# Patient Record
Sex: Male | Born: 1960 | Race: White | Hispanic: No | Marital: Married | Smoking: Never smoker
Health system: Southern US, Community
[De-identification: ages and names within clinical notes are randomized; demographics above are authoritative.]

## PROBLEM LIST (undated history)

## (undated) DIAGNOSIS — N200 Calculus of kidney: Secondary | ICD-10-CM

## (undated) DIAGNOSIS — I10 Essential (primary) hypertension: Secondary | ICD-10-CM

## (undated) DIAGNOSIS — F32A Depression, unspecified: Secondary | ICD-10-CM

## (undated) DIAGNOSIS — J189 Pneumonia, unspecified organism: Secondary | ICD-10-CM

## (undated) DIAGNOSIS — I639 Cerebral infarction, unspecified: Secondary | ICD-10-CM

## (undated) DIAGNOSIS — F329 Major depressive disorder, single episode, unspecified: Secondary | ICD-10-CM

## (undated) HISTORY — PX: OTHER SURGICAL HISTORY: SHX169

## (undated) HISTORY — PX: KNEE SURGERY: SHX244

## (undated) HISTORY — PX: CYSTOSCOPY: SUR368

## (undated) HISTORY — PX: HERNIA REPAIR: SHX51

## (undated) HISTORY — PX: JOINT REPLACEMENT: SHX530

## (undated) HISTORY — PX: LITHOTRIPSY: SUR834

---

## 2008-06-05 ENCOUNTER — Ambulatory Visit: Payer: Self-pay | Admitting: Radiology

## 2008-06-05 ENCOUNTER — Ambulatory Visit (HOSPITAL_BASED_OUTPATIENT_CLINIC_OR_DEPARTMENT_OTHER): Admission: RE | Admit: 2008-06-05 | Discharge: 2008-06-05 | Payer: Self-pay | Admitting: Orthopedic Surgery

## 2008-07-01 ENCOUNTER — Encounter: Admission: RE | Admit: 2008-07-01 | Discharge: 2008-09-04 | Payer: Self-pay | Admitting: Orthopedic Surgery

## 2008-09-24 ENCOUNTER — Ambulatory Visit: Payer: Self-pay | Admitting: Diagnostic Radiology

## 2008-09-24 ENCOUNTER — Ambulatory Visit (HOSPITAL_BASED_OUTPATIENT_CLINIC_OR_DEPARTMENT_OTHER): Admission: RE | Admit: 2008-09-24 | Discharge: 2008-09-24 | Payer: Self-pay | Admitting: Family Medicine

## 2008-10-09 ENCOUNTER — Ambulatory Visit (HOSPITAL_BASED_OUTPATIENT_CLINIC_OR_DEPARTMENT_OTHER): Admission: RE | Admit: 2008-10-09 | Discharge: 2008-10-09 | Payer: Self-pay | Admitting: Orthopedic Surgery

## 2008-10-09 ENCOUNTER — Ambulatory Visit: Payer: Self-pay | Admitting: Radiology

## 2009-12-04 ENCOUNTER — Inpatient Hospital Stay (HOSPITAL_COMMUNITY): Admission: RE | Admit: 2009-12-04 | Discharge: 2009-12-06 | Payer: Self-pay | Admitting: Orthopedic Surgery

## 2010-01-12 ENCOUNTER — Encounter: Admission: RE | Admit: 2010-01-12 | Discharge: 2010-03-16 | Payer: Self-pay | Admitting: Orthopedic Surgery

## 2010-02-02 ENCOUNTER — Encounter: Admission: RE | Admit: 2010-02-02 | Discharge: 2010-03-25 | Payer: Self-pay | Admitting: Internal Medicine

## 2010-05-07 ENCOUNTER — Inpatient Hospital Stay (HOSPITAL_COMMUNITY): Admission: RE | Admit: 2010-05-07 | Discharge: 2010-05-10 | Payer: Self-pay | Admitting: Orthopedic Surgery

## 2010-06-30 ENCOUNTER — Encounter
Admission: RE | Admit: 2010-06-30 | Discharge: 2010-07-28 | Payer: Self-pay | Source: Home / Self Care | Attending: Orthopedic Surgery | Admitting: Orthopedic Surgery

## 2010-07-22 ENCOUNTER — Ambulatory Visit (HOSPITAL_COMMUNITY)
Admission: RE | Admit: 2010-07-22 | Discharge: 2010-07-22 | Payer: Self-pay | Source: Home / Self Care | Attending: Orthopedic Surgery | Admitting: Orthopedic Surgery

## 2010-07-29 ENCOUNTER — Ambulatory Visit: Payer: BC Managed Care – PPO | Attending: Orthopedic Surgery | Admitting: Rehabilitation

## 2010-07-29 DIAGNOSIS — IMO0001 Reserved for inherently not codable concepts without codable children: Secondary | ICD-10-CM | POA: Insufficient documentation

## 2010-07-29 DIAGNOSIS — M25559 Pain in unspecified hip: Secondary | ICD-10-CM | POA: Insufficient documentation

## 2010-07-29 DIAGNOSIS — R262 Difficulty in walking, not elsewhere classified: Secondary | ICD-10-CM | POA: Insufficient documentation

## 2010-08-03 ENCOUNTER — Encounter: Payer: Self-pay | Admitting: Rehabilitation

## 2010-09-08 LAB — CBC
Hemoglobin: 10.7 g/dL — ABNORMAL LOW (ref 13.0–17.0)
MCH: 31.2 pg (ref 26.0–34.0)
MCH: 31.3 pg (ref 26.0–34.0)
MCHC: 34.5 g/dL (ref 30.0–36.0)
MCHC: 34.6 g/dL (ref 30.0–36.0)
MCHC: 34.8 g/dL (ref 30.0–36.0)
MCHC: 34.9 g/dL (ref 30.0–36.0)
MCV: 89.3 fL (ref 78.0–100.0)
MCV: 89.4 fL (ref 78.0–100.0)
Platelets: 153 10*3/uL (ref 150–400)
Platelets: 157 10*3/uL (ref 150–400)
Platelets: 168 10*3/uL (ref 150–400)
Platelets: 251 10*3/uL (ref 150–400)
RDW: 13.4 % (ref 11.5–15.5)
RDW: 13.6 % (ref 11.5–15.5)
RDW: 13.6 % (ref 11.5–15.5)
RDW: 13.8 % (ref 11.5–15.5)
WBC: 5.9 10*3/uL (ref 4.0–10.5)
WBC: 7.8 10*3/uL (ref 4.0–10.5)
WBC: 8.2 10*3/uL (ref 4.0–10.5)

## 2010-09-08 LAB — URINE MICROSCOPIC-ADD ON

## 2010-09-08 LAB — SURGICAL PCR SCREEN
MRSA, PCR: NEGATIVE
Staphylococcus aureus: NEGATIVE

## 2010-09-08 LAB — DIFFERENTIAL
Basophils Relative: 1 % (ref 0–1)
Lymphs Abs: 1.4 10*3/uL (ref 0.7–4.0)
Monocytes Absolute: 0.5 10*3/uL (ref 0.1–1.0)
Monocytes Relative: 8 % (ref 3–12)
Neutro Abs: 3.7 10*3/uL (ref 1.7–7.7)

## 2010-09-08 LAB — COMPREHENSIVE METABOLIC PANEL
ALT: 18 U/L (ref 0–53)
Albumin: 2.8 g/dL — ABNORMAL LOW (ref 3.5–5.2)
Albumin: 4.1 g/dL (ref 3.5–5.2)
Alkaline Phosphatase: 57 U/L (ref 39–117)
Alkaline Phosphatase: 64 U/L (ref 39–117)
BUN: 17 mg/dL (ref 6–23)
BUN: 9 mg/dL (ref 6–23)
Calcium: 8.1 mg/dL — ABNORMAL LOW (ref 8.4–10.5)
Potassium: 3.6 mEq/L (ref 3.5–5.1)
Potassium: 4.1 mEq/L (ref 3.5–5.1)
Sodium: 140 mEq/L (ref 135–145)
Sodium: 142 mEq/L (ref 135–145)
Total Protein: 5.4 g/dL — ABNORMAL LOW (ref 6.0–8.3)
Total Protein: 7.3 g/dL (ref 6.0–8.3)

## 2010-09-08 LAB — URINALYSIS, ROUTINE W REFLEX MICROSCOPIC
Glucose, UA: NEGATIVE mg/dL
Glucose, UA: NEGATIVE mg/dL
Ketones, ur: NEGATIVE mg/dL
Leukocytes, UA: NEGATIVE
Leukocytes, UA: NEGATIVE
Specific Gravity, Urine: 1.018 (ref 1.005–1.030)
Specific Gravity, Urine: 1.028 (ref 1.005–1.030)
pH: 6 (ref 5.0–8.0)
pH: 6.5 (ref 5.0–8.0)

## 2010-09-08 LAB — BASIC METABOLIC PANEL
BUN: 11 mg/dL (ref 6–23)
CO2: 28 mEq/L (ref 19–32)
Calcium: 8.2 mg/dL — ABNORMAL LOW (ref 8.4–10.5)
Calcium: 8.2 mg/dL — ABNORMAL LOW (ref 8.4–10.5)
Creatinine, Ser: 0.97 mg/dL (ref 0.4–1.5)
Creatinine, Ser: 1.06 mg/dL (ref 0.4–1.5)
GFR calc non Af Amer: >60 mL/min (ref >60)
Glucose, Bld: 120 mg/dL — ABNORMAL HIGH (ref 70–99)
Glucose, Bld: 126 mg/dL — ABNORMAL HIGH (ref 70–99)
Sodium: 141 mEq/L (ref 135–145)

## 2010-09-08 LAB — URINE CULTURE: Culture  Setup Time: 201111131722

## 2010-09-08 LAB — TYPE AND SCREEN
ABO/RH(D): O POS
Antibody Screen: NEGATIVE

## 2010-09-08 LAB — GLUCOSE, CAPILLARY: Glucose-Capillary: 150 mg/dL — ABNORMAL HIGH (ref 70–99)

## 2010-09-08 LAB — APTT: aPTT: 34 seconds (ref 24–37)

## 2010-09-14 LAB — BASIC METABOLIC PANEL
BUN: 10 mg/dL (ref 6–23)
CO2: 27 mEq/L (ref 19–32)
Calcium: 8.3 mg/dL — ABNORMAL LOW (ref 8.4–10.5)
Chloride: 105 mEq/L (ref 96–112)
Chloride: 110 mEq/L (ref 96–112)
Creatinine, Ser: 0.84 mg/dL (ref 0.4–1.5)
Creatinine, Ser: 0.96 mg/dL (ref 0.4–1.5)
GFR calc Af Amer: >60 mL/min (ref >60)
GFR calc Af Amer: >60 mL/min (ref >60)
GFR calc non Af Amer: >60 mL/min (ref >60)
Potassium: 3.7 mEq/L (ref 3.5–5.1)
Potassium: 4.1 mEq/L (ref 3.5–5.1)
Sodium: 140 mEq/L (ref 135–145)
Sodium: 142 mEq/L (ref 135–145)

## 2010-09-14 LAB — CBC
MCHC: 33.9 g/dL (ref 30.0–36.0)
MCV: 91.1 fL (ref 78.0–100.0)
MCV: 91.2 fL (ref 78.0–100.0)
Platelets: 185 10*3/uL (ref 150–400)
Platelets: 227 10*3/uL (ref 150–400)
RBC: 3.33 MIL/uL — ABNORMAL LOW (ref 4.22–5.81)
RBC: 4.66 MIL/uL (ref 4.22–5.81)
RDW: 13.5 % (ref 11.5–15.5)
WBC: 7.9 10*3/uL (ref 4.0–10.5)
WBC: 8.4 10*3/uL (ref 4.0–10.5)

## 2010-09-14 LAB — URINE MICROSCOPIC-ADD ON

## 2010-09-14 LAB — URINALYSIS, ROUTINE W REFLEX MICROSCOPIC
Glucose, UA: NEGATIVE mg/dL
Ketones, ur: NEGATIVE mg/dL
Leukocytes, UA: NEGATIVE
pH: 5.5 (ref 5.0–8.0)

## 2010-09-14 LAB — SURGICAL PCR SCREEN: Staphylococcus aureus: NEGATIVE

## 2010-09-14 LAB — DIFFERENTIAL
Eosinophils Absolute: 0.3 10*3/uL (ref 0.0–0.7)
Eosinophils Relative: 5 % (ref 0–5)
Lymphs Abs: 1.6 10*3/uL (ref 0.7–4.0)
Monocytes Relative: 9 % (ref 3–12)

## 2010-09-14 LAB — COMPREHENSIVE METABOLIC PANEL
ALT: 23 U/L (ref 0–53)
AST: 21 U/L (ref 0–37)
Calcium: 9.2 mg/dL (ref 8.4–10.5)
GFR calc Af Amer: >60 mL/min (ref >60)
Sodium: 145 mEq/L (ref 135–145)
Total Protein: 7.3 g/dL (ref 6.0–8.3)

## 2010-09-14 LAB — PROTIME-INR: INR: 0.97 (ref 0.00–1.49)

## 2011-07-05 ENCOUNTER — Emergency Department (INDEPENDENT_AMBULATORY_CARE_PROVIDER_SITE_OTHER): Payer: BC Managed Care – PPO

## 2011-07-05 ENCOUNTER — Emergency Department (HOSPITAL_BASED_OUTPATIENT_CLINIC_OR_DEPARTMENT_OTHER)
Admission: EM | Admit: 2011-07-05 | Discharge: 2011-07-05 | Disposition: A | Discharge status: Other Acute Inpatient Hospital | Payer: Self-pay | Attending: Emergency Medicine | Admitting: Emergency Medicine

## 2011-07-05 DIAGNOSIS — N201 Calculus of ureter: Secondary | ICD-10-CM

## 2011-07-05 DIAGNOSIS — I10 Essential (primary) hypertension: Secondary | ICD-10-CM | POA: Insufficient documentation

## 2011-07-05 DIAGNOSIS — Z87442 Personal history of urinary calculi: Secondary | ICD-10-CM | POA: Insufficient documentation

## 2011-07-05 DIAGNOSIS — M549 Dorsalgia, unspecified: Secondary | ICD-10-CM | POA: Insufficient documentation

## 2011-07-05 DIAGNOSIS — R109 Unspecified abdominal pain: Secondary | ICD-10-CM | POA: Insufficient documentation

## 2011-07-05 DIAGNOSIS — Z8679 Personal history of other diseases of the circulatory system: Secondary | ICD-10-CM | POA: Insufficient documentation

## 2011-07-05 DIAGNOSIS — N1330 Unspecified hydronephrosis: Secondary | ICD-10-CM

## 2011-07-05 HISTORY — DX: Cerebral infarction, unspecified: I63.9

## 2011-07-05 HISTORY — DX: Depression, unspecified: F32.A

## 2011-07-05 HISTORY — DX: Essential (primary) hypertension: I10

## 2011-07-05 HISTORY — DX: Calculus of kidney: N20.0

## 2011-07-05 HISTORY — DX: Major depressive disorder, single episode, unspecified: F32.9

## 2011-07-05 LAB — URINALYSIS, ROUTINE W REFLEX MICROSCOPIC
Glucose, UA: NEGATIVE mg/dL
Specific Gravity, Urine: 1.031 — ABNORMAL HIGH (ref 1.005–1.030)
Urobilinogen, UA: 1 mg/dL (ref 0.0–1.0)

## 2011-07-05 LAB — URINE MICROSCOPIC-ADD ON

## 2011-07-05 MED ORDER — ONDANSETRON HCL 4 MG/2ML IJ SOLN
4.0000 mg | Freq: Once | INTRAMUSCULAR | Status: AC
  Administered 2011-07-05: 4 mg via INTRAVENOUS
  Filled 2011-07-05: qty 2

## 2011-07-05 MED ORDER — HYDROMORPHONE HCL PF 1 MG/ML IJ SOLN
1.0000 mg | Freq: Once | INTRAMUSCULAR | Status: AC
  Administered 2011-07-05: 1 mg via INTRAVENOUS
  Filled 2011-07-05: qty 1

## 2011-07-05 MED ORDER — FENTANYL CITRATE 0.05 MG/ML IJ SOLN
INTRAMUSCULAR | Status: AC
  Filled 2011-07-05: qty 2

## 2011-07-05 MED ORDER — HYDROMORPHONE HCL PF 1 MG/ML IJ SOLN
1.0000 mg | Freq: Once | INTRAMUSCULAR | Status: AC
Start: 2011-07-05 — End: 2011-07-05
  Administered 2011-07-05: 1 mg via INTRAVENOUS
  Filled 2011-07-05: qty 1

## 2011-07-05 MED ORDER — FENTANYL CITRATE 0.05 MG/ML IJ SOLN
50.0000 ug | Freq: Once | INTRAMUSCULAR | Status: AC
  Administered 2011-07-05: 50 ug via INTRAVENOUS

## 2011-07-05 NOTE — ED Notes (Signed)
REPORT CALLED TO  Maryclare Labrador

## 2011-07-05 NOTE — ED Provider Notes (Signed)
History     CSN: [id number]  Arrival date & time 07/05/11  1136   None     Chief Complaint  Patient presents with  . Flank Pain    (Consider location/radiation/quality/duration/timing/severity/associated sxs/prior treatment) Patient is a 51 y.o. male presenting with flank pain. The history is provided by the patient. No language interpreter was used.  Flank Pain This is a new problem. The current episode started today. The problem occurs constantly. The problem has been gradually worsening. Associated symptoms include abdominal pain and urinary symptoms. The symptoms are aggravated by nothing. He has tried nothing for the symptoms. The treatment provided moderate relief.  Pt complains of pain in his left back and groin.  Pt reports he has had kidney stones in the past.  Pt reports he sees Dr. Cleatrice Burke in [facility].  Pt reports the last time he had a stone it had to be removed.  Past Medical History  Diagnosis Date  . Kidney stones   . Stroke   . Hypertension   . Depression     Past Surgical History  Procedure Date  . Joint replacement   . Knee surgery   . Hernia repair   . Cystoscopy     No family history on file.  History  Substance Use Topics  . Smoking status: Never Smoker   . Smokeless tobacco: Never Used  . Alcohol Use: No      Review of Systems  Gastrointestinal: Positive for abdominal pain.  Genitourinary: Positive for flank pain.  All other systems reviewed and are negative.    Allergies  Review of patient's allergies indicates no known allergies.  Home Medications   Current Outpatient Rx  Name Route Sig Dispense Refill  . BUPROPION HCL 100 MG PO TABS Oral Take by mouth daily.        BP 144/95  Pulse 88  Temp(Src) 99.6 F (37.6 C) (Oral)  Resp 16  Wt 210 lb (95.255 kg)  SpO2 100%  Physical Exam  Nursing note and vitals reviewed. Constitutional: He is oriented to person, place, and time. He appears well-developed and well-nourished.    HENT:  Head: Normocephalic and atraumatic.  Right Ear: External ear normal.  Left Ear: External ear normal.  Nose: Nose normal.  Mouth/Throat: Oropharynx is clear and moist.  Eyes: Conjunctivae and EOM are normal. Pupils are equal, round, and reactive to light.  Neck: Normal range of motion. Neck supple.  Cardiovascular: Normal rate and normal heart sounds.   Pulmonary/Chest: Effort normal.  Abdominal: Soft. There is tenderness.  Musculoskeletal: Normal range of motion.  Neurological: He is alert and oriented to person, place, and time. He has normal reflexes.  Skin: Skin is warm.  Psychiatric: He has a normal mood and affect.    ED Course  Procedures (including critical care time)  Labs Reviewed  URINALYSIS, ROUTINE W REFLEX MICROSCOPIC - Abnormal; Notable for the following:    APPearance CLOUDY (*)    Specific Gravity, Urine 1.031 (*)    Hgb urine dipstick SMALL (*)    Ketones, ur 40 (*)    Protein, ur 30 (*)    Leukocytes, UA TRACE (*)    All other components within normal limits  URINE MICROSCOPIC-ADD ON - Abnormal; Notable for the following:    Bacteria, UA MANY (*)    All other components within normal limits   No results found.   No diagnosis found.    MDM   Results for orders placed during the hospital  encounter of 07/05/11  URINALYSIS, ROUTINE W REFLEX MICROSCOPIC      Component Value Range   Color, Urine YELLOW  YELLOW    APPearance CLOUDY (*) CLEAR    Specific Gravity, Urine 1.031 (*) 1.005 - 1.030    pH 5.5  5.0 - 8.0    Glucose, UA NEGATIVE  NEGATIVE (mg/dL)   Hgb urine dipstick SMALL (*) NEGATIVE    Bilirubin Urine NEGATIVE  NEGATIVE    Ketones, ur 40 (*) NEGATIVE (mg/dL)   Protein, ur 30 (*) NEGATIVE (mg/dL)   Urobilinogen, UA 1.0  0.0 - 1.0 (mg/dL)   Nitrite NEGATIVE  NEGATIVE    Leukocytes, UA TRACE (*) NEGATIVE   URINE MICROSCOPIC-ADD ON      Component Value Range   Squamous Epithelial / LPF RARE  RARE    WBC, UA 3-6  <3 (WBC/hpf)   RBC  / HPF 11-20  <3 (RBC/hpf)   Bacteria, UA MANY (*) RARE    Ct Abdomen Pelvis Wo Contrast  07/05/2011  *RADIOLOGY REPORT*  Clinical Data: 51 year old with left flank pain.  History of kidney stones.  CT ABDOMEN AND PELVIS WITHOUT CONTRAST  Technique:  Multidetector CT imaging of the abdomen and pelvis was performed following the standard protocol without intravenous contrast.  Comparison: CT chest 07/22/2010 and 07/08/2009  Findings: Stable parenchymal density in the medial right middle lobe most likely represents scar due to the minimal change. Remainder of the lungs are clear.  There is no evidence for free air.  No gross abnormality to the liver, gallbladder, spleen, pancreas or adrenal glands.  Images of pelvis are limited due to metallic artifact from bilateral hip replacements.  There is severe left hydronephrosis with left perinephric stranding.  There is a large stone at the left ureteropelvic junction measuring up to 1 cm. There is also a 0.5 cm stone in the distal left ureter causing dilatation of the left ureter.  There are no other definite kidney stones.  Evaluation of the urinary bladder is markedly limited due to metallic artifact.  There is extensive diverticulosis involving the left colon without acute inflammation.  There are three adjacent calcifications that may be associated with the appendix. The largest measures up to 1.2 cm.  The appendix has a normal morphology without inflammation.  No evidence for free fluid or lymphadenopathy in the abdomen or pelvis.  There are degenerative facet changes at L4-L5.  No acute bony abnormality.  IMPRESSION: Severe left hydronephrosis due to a 0.5 cm stone in the distal left ureter and 1 cm stone at the left ureteropelvic junction.  Prominent calcifications adjacent to or involving the appendix.  No evidence for acute appendix inflammation.  Original Report Authenticated By: Richarda Overlie, M.D.     Pt reports minimal relief with dilaudid.  I spoke to Dr.  Cleatrice [facility] who will admit.   Pt counseled on diagnosis and treatment plan      Langston Masker, [place] 07/05/11 1539

## 2011-07-05 NOTE — ED Notes (Signed)
Pt reports he developed left flank pain radiating to left groin this am.

## 2011-07-05 NOTE — ED Provider Notes (Signed)
Medical screening examination/treatment/procedure(s) were performed by non-physician practitioner and as supervising physician I was immediately available for consultation/collaboration.   Dayton Bailiff, MD 07/05/11 [phone]

## 2011-07-05 NOTE — ED Notes (Signed)
Pt states still having severe pain medication "may have taken the edge off a tiny bit" but not much will notify MD

## 2014-06-04 ENCOUNTER — Emergency Department (HOSPITAL_BASED_OUTPATIENT_CLINIC_OR_DEPARTMENT_OTHER): Payer: BLUE CROSS/BLUE SHIELD

## 2014-06-04 ENCOUNTER — Encounter (HOSPITAL_BASED_OUTPATIENT_CLINIC_OR_DEPARTMENT_OTHER): Payer: Self-pay | Admitting: Emergency Medicine

## 2014-06-04 ENCOUNTER — Inpatient Hospital Stay (HOSPITAL_BASED_OUTPATIENT_CLINIC_OR_DEPARTMENT_OTHER)
Admission: EM | Admit: 2014-06-04 | Discharge: 2014-06-07 | DRG: 871 | Disposition: A | Discharge status: Home / Self Care | Payer: BLUE CROSS/BLUE SHIELD | Attending: Internal Medicine | Admitting: Internal Medicine

## 2014-06-04 DIAGNOSIS — J029 Acute pharyngitis, unspecified: Secondary | ICD-10-CM | POA: Diagnosis present

## 2014-06-04 DIAGNOSIS — J479 Bronchiectasis, uncomplicated: Secondary | ICD-10-CM | POA: Diagnosis present

## 2014-06-04 DIAGNOSIS — I10 Essential (primary) hypertension: Secondary | ICD-10-CM | POA: Diagnosis present

## 2014-06-04 DIAGNOSIS — J449 Chronic obstructive pulmonary disease, unspecified: Secondary | ICD-10-CM | POA: Diagnosis present

## 2014-06-04 DIAGNOSIS — R05 Cough: Secondary | ICD-10-CM

## 2014-06-04 DIAGNOSIS — Z8673 Personal history of transient ischemic attack (TIA), and cerebral infarction without residual deficits: Secondary | ICD-10-CM

## 2014-06-04 DIAGNOSIS — F329 Major depressive disorder, single episode, unspecified: Secondary | ICD-10-CM | POA: Diagnosis present

## 2014-06-04 DIAGNOSIS — E876 Hypokalemia: Secondary | ICD-10-CM | POA: Diagnosis present

## 2014-06-04 DIAGNOSIS — A419 Sepsis, unspecified organism: Principal | ICD-10-CM | POA: Diagnosis present

## 2014-06-04 DIAGNOSIS — Z87442 Personal history of urinary calculi: Secondary | ICD-10-CM

## 2014-06-04 DIAGNOSIS — R509 Fever, unspecified: Secondary | ICD-10-CM | POA: Diagnosis not present

## 2014-06-04 DIAGNOSIS — J471 Bronchiectasis with (acute) exacerbation: Secondary | ICD-10-CM

## 2014-06-04 DIAGNOSIS — J189 Pneumonia, unspecified organism: Secondary | ICD-10-CM | POA: Diagnosis present

## 2014-06-04 DIAGNOSIS — J42 Unspecified chronic bronchitis: Secondary | ICD-10-CM | POA: Diagnosis present

## 2014-06-04 DIAGNOSIS — J45909 Unspecified asthma, uncomplicated: Secondary | ICD-10-CM | POA: Diagnosis present

## 2014-06-04 DIAGNOSIS — R059 Cough, unspecified: Secondary | ICD-10-CM

## 2014-06-04 HISTORY — DX: Pneumonia, unspecified organism: J18.9

## 2014-06-04 LAB — CBC WITH DIFFERENTIAL/PLATELET
Basophils Absolute: 0 10*3/uL (ref 0.0–0.1)
Basophils Relative: 0 % (ref 0–1)
EOS ABS: 0.2 10*3/uL (ref 0.0–0.7)
EOS PCT: 1 % (ref 0–5)
HCT: 39.1 % (ref 39.0–52.0)
HEMOGLOBIN: 13.2 g/dL (ref 13.0–17.0)
LYMPHS PCT: 10 % — AB (ref 12–46)
Lymphs Abs: 1.4 10*3/uL (ref 0.7–4.0)
MCH: 30.6 pg (ref 26.0–34.0)
MCHC: 33.8 g/dL (ref 30.0–36.0)
MCV: 90.7 fL (ref 78.0–100.0)
MONOS PCT: 14 % — AB (ref 3–12)
Monocytes Absolute: 1.8 10*3/uL — ABNORMAL HIGH (ref 0.1–1.0)
Neutro Abs: 9.9 10*3/uL — ABNORMAL HIGH (ref 1.7–7.7)
Neutrophils Relative %: 75 % (ref 43–77)
Platelets: 222 10*3/uL (ref 150–400)
RBC: 4.31 MIL/uL (ref 4.22–5.81)
RDW: 13.2 % (ref 11.5–15.5)
WBC: 13.3 10*3/uL — AB (ref 4.0–10.5)

## 2014-06-04 LAB — BASIC METABOLIC PANEL
Anion gap: 15 (ref 5–15)
BUN: 17 mg/dL (ref 6–23)
CO2: 23 mEq/L (ref 19–32)
Calcium: 8.8 mg/dL (ref 8.4–10.5)
Chloride: 103 mEq/L (ref 96–112)
Creatinine, Ser: 1.1 mg/dL (ref 0.50–1.35)
GFR calc Af Amer: 86 mL/min — ABNORMAL LOW (ref >90)
GFR, EST NON AFRICAN AMERICAN: 74 mL/min — AB (ref >90)
Glucose, Bld: 127 mg/dL — ABNORMAL HIGH (ref 70–99)
POTASSIUM: 3.8 meq/L (ref 3.7–5.3)
Sodium: 141 mEq/L (ref 137–147)

## 2014-06-04 LAB — URINALYSIS, ROUTINE W REFLEX MICROSCOPIC
Bilirubin Urine: NEGATIVE
GLUCOSE, UA: NEGATIVE mg/dL
HGB URINE DIPSTICK: NEGATIVE
Ketones, ur: 15 mg/dL — AB
Leukocytes, UA: NEGATIVE
Nitrite: NEGATIVE
PH: 6 (ref 5.0–8.0)
PROTEIN: NEGATIVE mg/dL
Specific Gravity, Urine: 1.028 (ref 1.005–1.030)
Urobilinogen, UA: 1 mg/dL (ref 0.0–1.0)

## 2014-06-04 MED ORDER — IPRATROPIUM-ALBUTEROL 0.5-2.5 (3) MG/3ML IN SOLN
3.0000 mL | Freq: Once | RESPIRATORY_TRACT | Status: AC
  Administered 2014-06-04: 3 mL via RESPIRATORY_TRACT
  Filled 2014-06-04: qty 3

## 2014-06-04 MED ORDER — ACETAMINOPHEN 325 MG PO TABS
ORAL_TABLET | ORAL | Status: AC
  Filled 2014-06-04: qty 2

## 2014-06-04 MED ORDER — ALBUTEROL SULFATE (2.5 MG/3ML) 0.083% IN NEBU
2.5000 mg | INHALATION_SOLUTION | Freq: Once | RESPIRATORY_TRACT | Status: AC
  Administered 2014-06-04: 2.5 mg via RESPIRATORY_TRACT
  Filled 2014-06-04: qty 3

## 2014-06-04 MED ORDER — ACETAMINOPHEN 325 MG PO TABS
650.0000 mg | ORAL_TABLET | Freq: Once | ORAL | Status: AC
  Administered 2014-06-04: 650 mg via ORAL

## 2014-06-04 NOTE — ED Notes (Signed)
Cough x2-3 months  [facility]  Today started with fever

## 2014-06-04 NOTE — ED Provider Notes (Signed)
CSN: [id number]     Arrival date & time 06/04/14  2149 History  This chart was scribed for Dustin Randall Dustin Cords, MD by Tonye Royalty, ED Scribe. This patient was seen in room MH10/MH10 and the patient's care was started at 11:29 PM.    Chief Complaint  Patient presents with  . Fever   Patient is a 53 y.o. male presenting with fever. The history is provided by the patient and the spouse. No language interpreter was used.  Fever Severity:  Moderate Duration:  1 day Timing:  Constant Progression:  Worsening Chronicity:  New Relieved by:  None tried Worsened by:  Nothing tried Ineffective treatments:  None tried Associated symptoms: congestion, cough, myalgias and sore throat   Cough:    Cough characteristics:  Productive   Sputum characteristics:  Yellow and bloody   Severity:  Moderate   Onset quality:  Gradual   Duration:  3 months   Timing:  Constant   Chronicity:  New   HPI Comments: Dustin Randall is a 53 y.o. male with history of COPD and asthma who presents to the Emergency Department complaining of cough with onset 3 months ago and fever with onset today. Per wife, his cough produces yellow phlegm and notes blood in it. He reports associated body aches, sore throat, and nasal congestion. Myalgias are new. He has been evaluated at an urgent care and at [facility] twice, he has been diagnosed with bronchitis and with allergies and treatments include antibiotics, steroid, Flonase, Mucinex DM, Allegra D, cough medicine. His PCP is Dr. Littie Deeds but has not been able to get in to see him. He denies ever smoking. He has not seen a pulmonologist.   Past Medical History  Diagnosis Date  . Kidney stones   . Stroke   . Hypertension   . Depression   . Pneumonia    Past Surgical History  Procedure Laterality Date  . Joint replacement    . Knee surgery    . Hernia repair    . Cystoscopy    . Hip replacements     No family history on file. History  Substance Use Topics  . Smoking  status: Never Smoker   . Smokeless tobacco: Never Used  . Alcohol Use: No    Review of Systems  Constitutional: Positive for fever.  HENT: Positive for congestion and sore throat.   Respiratory: Positive for cough.   Musculoskeletal: Positive for myalgias.  All other systems reviewed and are negative.     Allergies  Review of patient's allergies indicates no known allergies.  Home Medications   Prior to Admission medications   Medication Sig Start Date End Date Taking? Authorizing Provider  buPROPion (WELLBUTRIN) 100 MG tablet Take by mouth daily.      Historical Provider, MD   BP 130/67 mmHg  Pulse 118  Temp(Src) 102.7 F (39.3 C) (Oral)  Resp 24  Ht 5\' 9"  (1.753 m)  Wt 205 lb (92.987 kg)  BMI 30.26 kg/m2  SpO2 92% Physical Exam  Constitutional: He is oriented to person, place, and time. He appears well-developed and well-nourished.  HENT:  Head: Normocephalic and atraumatic.  Mouth/Throat: Oropharynx is clear and moist. No oropharyngeal exudate.  Eyes: Conjunctivae are normal. Pupils are equal, round, and reactive to light.  Neck: Normal range of motion. Neck supple.  Cardiovascular: Normal rate and regular rhythm.   Pulmonary/Chest: Effort normal. He has wheezes.  Rhonchi present No rales  Abdominal: Soft. Bowel sounds are normal. There is  no tenderness. There is no rebound.  Musculoskeletal: Normal range of motion.  Neurological: He is alert and oriented to person, place, and time.  Skin: Skin is warm and dry. He is not diaphoretic.  Psychiatric: He has a normal mood and affect.  Nursing note and vitals reviewed.   ED Course  Procedures (including critical care time)  DIAGNOSTIC STUDIES: Oxygen Saturation is 93% on room air, adequate by my interpretation.    COORDINATION OF CARE: 11:35 PM Discussed treatment plan with patient at beside, the patient agrees with the plan and has no further questions at this time.   Labs Review Labs Reviewed  CBC WITH  DIFFERENTIAL - Abnormal; Notable for the following:    WBC 13.3 (*)    Neutro Abs 9.9 (*)    Lymphocytes Relative 10 (*)    Monocytes Relative 14 (*)    Monocytes Absolute 1.8 (*)    All other components within normal limits  BASIC METABOLIC PANEL - Abnormal; Notable for the following:    Glucose, Bld 127 (*)    GFR calc non Af Amer 74 (*)    GFR calc Af Amer 86 (*)    All other components within normal limits  URINALYSIS, ROUTINE W REFLEX MICROSCOPIC - Abnormal; Notable for the following:    Ketones, ur 15 (*)    All other components within normal limits    Imaging Review Dg Chest 2 View  06/04/2014   CLINICAL DATA:  Coughing for 1 month, fever, congestion  EXAM: CHEST  2 VIEW  COMPARISON:  11/25/2009  FINDINGS: The heart size and mediastinal contours are within normal limits. Both lungs are clear. The visualized skeletal structures are unremarkable.  IMPRESSION: No active cardiopulmonary disease.   Electronically Signed   By: Elige KoHetal  Patel   On: 06/04/2014 22:36     EKG Interpretation None      MDM   Final diagnoses:  Cough    Results for orders placed or performed during the hospital encounter of 06/04/14  CBC with Differential  Result Value Ref Range   WBC 13.3 (H) 4.0 - 10.5 K/uL   RBC 4.31 4.22 - 5.81 MIL/uL   Hemoglobin 13.2 13.0 - 17.0 g/dL   HCT 16.139.1 09.639.0 - 04.552.0 %   MCV 90.7 78.0 - 100.0 fL   MCH 30.6 26.0 - 34.0 pg   MCHC 33.8 30.0 - 36.0 g/dL   RDW 40.913.2 81.111.5 - 91.415.5 %   Platelets 222 150 - 400 K/uL   Neutrophils Relative % 75 43 - 77 %   Neutro Abs 9.9 (H) 1.7 - 7.7 K/uL   Lymphocytes Relative 10 (L) 12 - 46 %   Lymphs Abs 1.4 0.7 - 4.0 K/uL   Monocytes Relative 14 (H) 3 - 12 %   Monocytes Absolute 1.8 (H) 0.1 - 1.0 K/uL   Eosinophils Relative 1 0 - 5 %   Eosinophils Absolute 0.2 0.0 - 0.7 K/uL   Basophils Relative 0 0 - 1 %   Basophils Absolute 0.0 0.0 - 0.1 K/uL  Basic metabolic panel  Result Value Ref Range   Sodium 141 137 - 147 mEq/L   Potassium  3.8 3.7 - 5.3 mEq/L   Chloride 103 96 - 112 mEq/L   CO2 23 19 - 32 mEq/L   Glucose, Bld 127 (H) 70 - 99 mg/dL   BUN 17 6 - 23 mg/dL   Creatinine, Ser 7.821.10 0.50 - 1.35 mg/dL   Calcium 8.8 8.4 - 95.610.5 mg/dL  GFR calc non Af Amer 74 (L) >90 mL/min   GFR calc Af Amer 86 (L) >90 mL/min   Anion gap 15 5 - 15  Urinalysis, Routine w reflex microscopic  Result Value Ref Range   Color, Urine YELLOW YELLOW   APPearance CLEAR CLEAR   Specific Gravity, Urine 1.028 1.005 - 1.030   pH 6.0 5.0 - 8.0   Glucose, UA NEGATIVE NEGATIVE mg/dL   Hgb urine dipstick NEGATIVE NEGATIVE   Bilirubin Urine NEGATIVE NEGATIVE   Ketones, ur 15 (A) NEGATIVE mg/dL   Protein, ur NEGATIVE NEGATIVE mg/dL   Urobilinogen, UA 1.0 0.0 - 1.0 mg/dL   Nitrite NEGATIVE NEGATIVE   Leukocytes, UA NEGATIVE NEGATIVE   Dg Chest 2 View  06/04/2014   CLINICAL DATA:  Coughing for 1 month, fever, congestion  EXAM: CHEST  2 VIEW  COMPARISON:  11/25/2009  FINDINGS: The heart size and mediastinal contours are within normal limits. Both lungs are clear. The visualized skeletal structures are unremarkable.  IMPRESSION: No active cardiopulmonary disease.   Electronically Signed   By: Elige Ko   On: 06/04/2014 22:36   Ct Angio Chest Pe W/cm &/or Wo Cm  06/05/2014   CLINICAL DATA:  Fever, cough, sore throat, hemoptysis. History of hypertension.  EXAM: CT ANGIOGRAPHY CHEST WITH CONTRAST  TECHNIQUE: Multidetector CT imaging of the chest was performed using the standard protocol during bolus administration of intravenous contrast. Multiplanar CT image reconstructions and MIPs were obtained to evaluate the vascular anatomy.  CONTRAST:  OMNIPAQUE IOHEXOL 350 MG/ML SOLN  COMPARISON:  07/22/2010  FINDINGS: Technically adequate study with moderately good opacification of the central and segmental pulmonary arteries. No focal filling defects are demonstrated. No evidence of significant pulmonary embolus.  Normal heart size. Normal caliber thoracic  aorta. No evidence of aortic dissection allowing for motion artifact. Great vessel origins are patent. Esophagus is decompressed. No significant lymphadenopathy in the chest.  Atelectasis or infiltration is demonstrated in both costophrenic angles posteriorly. With focal consolidation and with some mucous plugging demonstrated in the right lung base. Mild bronchiectasis. No pleural effusions. No pneumothorax.  Included portions of the upper abdominal organs are grossly unremarkable. Mild degenerative changes in the thoracic spine.  Review of the MIP images confirms the above findings.  IMPRESSION: No evidence of significant pulmonary embolus.  Infiltration or consolidation in the lung bases associated with Mild bronchiectasis and mucous plugging.   Electronically Signed   By: Burman Nieves M.D.   On: 06/05/2014 00:37    Medications  cefTRIAXone (ROCEPHIN) 1 g in dextrose 5 % 50 mL IVPB (1 g Intravenous New Bag/Given 06/05/14 0208)  azithromycin (ZITHROMAX) 500 mg in dextrose 5 % 250 mL IVPB (not administered)  acetaminophen (TYLENOL) tablet 650 mg (650 mg Oral Given 06/04/14 2214)  albuterol (PROVENTIL) (2.5 MG/3ML) 0.083% nebulizer solution 2.5 mg (2.5 mg Nebulization Given 06/04/14 2253)  ipratropium-albuterol (DUONEB) 0.5-2.5 (3) MG/3ML nebulizer solution 3 mL (3 mLs Nebulization Given 06/04/14 2253)  sodium chloride 0.9 % bolus 500 mL (0 mLs Intravenous Stopped 06/05/14 0154)  ketorolac (TORADOL) 30 MG/ML injection 30 mg (30 mg Intravenous Given 06/05/14 0037)  albuterol (PROVENTIL) (2.5 MG/3ML) 0.083% nebulizer solution 5 mg (5 mg Nebulization Given 06/05/14 0004)  iohexol (OMNIPAQUE) 350 MG/ML injection 80 mL (100 mLs Intravenous Contrast Given 06/05/14 0020)  albuterol (PROVENTIL) (2.5 MG/3ML) 0.083% nebulizer solution 5 mg (5 mg Nebulization Given 06/05/14 0114)  methylPREDNISolone sodium succinate (SOLU-MEDROL) 125 mg/2 mL injection 125 mg (125 mg Intravenous Given  06/05/14 0206)  sodium chloride  0.9 % bolus 1,000 mL (1,000 mLs Intravenous New Bag/Given 06/05/14 0206)  cefTRIAXone (ROCEPHIN) 1 G injection (  Duplicate 06/05/14 0208)   204 Case d/w Dr. Julian ReilGardner admit to inpatient tele  I personally performed the services described in this documentation, which was scribed in my presence. The recorded information has been reviewed and is accurate.     Jasmine AweApril K Merin Borjon-Rasch, MD 06/05/14 [phone]

## 2014-06-05 ENCOUNTER — Emergency Department (HOSPITAL_BASED_OUTPATIENT_CLINIC_OR_DEPARTMENT_OTHER): Payer: BLUE CROSS/BLUE SHIELD

## 2014-06-05 ENCOUNTER — Encounter (HOSPITAL_BASED_OUTPATIENT_CLINIC_OR_DEPARTMENT_OTHER): Payer: Self-pay | Admitting: Emergency Medicine

## 2014-06-05 DIAGNOSIS — Z8673 Personal history of transient ischemic attack (TIA), and cerebral infarction without residual deficits: Secondary | ICD-10-CM | POA: Diagnosis not present

## 2014-06-05 DIAGNOSIS — J470 Bronchiectasis with acute lower respiratory infection: Secondary | ICD-10-CM

## 2014-06-05 DIAGNOSIS — E876 Hypokalemia: Secondary | ICD-10-CM | POA: Diagnosis present

## 2014-06-05 DIAGNOSIS — J45909 Unspecified asthma, uncomplicated: Secondary | ICD-10-CM | POA: Diagnosis present

## 2014-06-05 DIAGNOSIS — J189 Pneumonia, unspecified organism: Secondary | ICD-10-CM | POA: Diagnosis present

## 2014-06-05 DIAGNOSIS — A419 Sepsis, unspecified organism: Secondary | ICD-10-CM | POA: Diagnosis present

## 2014-06-05 DIAGNOSIS — J479 Bronchiectasis, uncomplicated: Secondary | ICD-10-CM | POA: Diagnosis present

## 2014-06-05 DIAGNOSIS — J449 Chronic obstructive pulmonary disease, unspecified: Secondary | ICD-10-CM | POA: Diagnosis present

## 2014-06-05 DIAGNOSIS — Z87442 Personal history of urinary calculi: Secondary | ICD-10-CM | POA: Diagnosis not present

## 2014-06-05 DIAGNOSIS — I10 Essential (primary) hypertension: Secondary | ICD-10-CM | POA: Diagnosis present

## 2014-06-05 DIAGNOSIS — F329 Major depressive disorder, single episode, unspecified: Secondary | ICD-10-CM | POA: Diagnosis present

## 2014-06-05 DIAGNOSIS — J029 Acute pharyngitis, unspecified: Secondary | ICD-10-CM | POA: Diagnosis present

## 2014-06-05 DIAGNOSIS — R509 Fever, unspecified: Secondary | ICD-10-CM | POA: Diagnosis present

## 2014-06-05 DIAGNOSIS — J42 Unspecified chronic bronchitis: Secondary | ICD-10-CM | POA: Diagnosis present

## 2014-06-05 LAB — EXPECTORATED SPUTUM ASSESSMENT W GRAM STAIN, RFLX TO RESP C

## 2014-06-05 LAB — EXPECTORATED SPUTUM ASSESSMENT W REFEX TO RESP CULTURE

## 2014-06-05 LAB — HIV ANTIBODY (ROUTINE TESTING W REFLEX): HIV 1&2 Ab, 4th Generation: NONREACTIVE

## 2014-06-05 LAB — STREP PNEUMONIAE URINARY ANTIGEN: Strep Pneumo Urinary Antigen: NEGATIVE

## 2014-06-05 MED ORDER — HEPARIN SODIUM (PORCINE) 5000 UNIT/ML IJ SOLN
5000.0000 [IU] | Freq: Three times a day (TID) | INTRAMUSCULAR | Status: DC
  Administered 2014-06-05 – 2014-06-07 (×7): 5000 [IU] via SUBCUTANEOUS
  Filled 2014-06-05 (×12): qty 1

## 2014-06-05 MED ORDER — BUPROPION HCL 100 MG PO TABS
100.0000 mg | ORAL_TABLET | Freq: Every day | ORAL | Status: DC
  Administered 2014-06-05 – 2014-06-07 (×3): 100 mg via ORAL
  Filled 2014-06-05 (×3): qty 1

## 2014-06-05 MED ORDER — SODIUM CHLORIDE 0.9 % IV SOLN
INTRAVENOUS | Status: DC
  Administered 2014-06-05: 04:00:00 via INTRAVENOUS

## 2014-06-05 MED ORDER — SODIUM CHLORIDE 0.9 % IV BOLUS (SEPSIS)
500.0000 mL | Freq: Once | INTRAVENOUS | Status: AC
  Administered 2014-06-05: 500 mL via INTRAVENOUS

## 2014-06-05 MED ORDER — ALBUTEROL SULFATE (2.5 MG/3ML) 0.083% IN NEBU
5.0000 mg | INHALATION_SOLUTION | Freq: Once | RESPIRATORY_TRACT | Status: AC
  Administered 2014-06-05: 5 mg via RESPIRATORY_TRACT
  Filled 2014-06-05: qty 6

## 2014-06-05 MED ORDER — ACETAMINOPHEN 325 MG PO TABS
650.0000 mg | ORAL_TABLET | Freq: Four times a day (QID) | ORAL | Status: DC | PRN
  Administered 2014-06-05 – 2014-06-06 (×3): 650 mg via ORAL
  Filled 2014-06-05 (×3): qty 2

## 2014-06-05 MED ORDER — DEXTROSE 5 % IV SOLN
500.0000 mg | Freq: Once | INTRAVENOUS | Status: AC
  Administered 2014-06-05: 500 mg via INTRAVENOUS
  Filled 2014-06-05: qty 500

## 2014-06-05 MED ORDER — CEFTRIAXONE SODIUM IN DEXTROSE 20 MG/ML IV SOLN
1.0000 g | INTRAVENOUS | Status: DC
  Administered 2014-06-05 – 2014-06-06 (×2): 1 g via INTRAVENOUS
  Filled 2014-06-05 (×3): qty 50

## 2014-06-05 MED ORDER — PREDNISONE 20 MG PO TABS
40.0000 mg | ORAL_TABLET | Freq: Every day | ORAL | Status: DC
  Administered 2014-06-05 – 2014-06-07 (×3): 40 mg via ORAL
  Filled 2014-06-05 (×6): qty 2

## 2014-06-05 MED ORDER — DEXTROSE 5 % IV SOLN
500.0000 mg | INTRAVENOUS | Status: DC
  Administered 2014-06-05: 500 mg via INTRAVENOUS
  Filled 2014-06-05 (×2): qty 500

## 2014-06-05 MED ORDER — CEFTRIAXONE SODIUM 1 G IJ SOLR
INTRAMUSCULAR | Status: AC
  Filled 2014-06-05: qty 10

## 2014-06-05 MED ORDER — IOHEXOL 350 MG/ML SOLN
80.0000 mL | Freq: Once | INTRAVENOUS | Status: AC | PRN
  Administered 2014-06-05: 100 mL via INTRAVENOUS

## 2014-06-05 MED ORDER — KETOROLAC TROMETHAMINE 30 MG/ML IJ SOLN
30.0000 mg | Freq: Once | INTRAMUSCULAR | Status: AC
  Administered 2014-06-05: 30 mg via INTRAVENOUS
  Filled 2014-06-05: qty 1

## 2014-06-05 MED ORDER — DEXTROSE 5 % IV SOLN
1.0000 g | Freq: Once | INTRAVENOUS | Status: AC
  Administered 2014-06-05: 1 g via INTRAVENOUS

## 2014-06-05 MED ORDER — METHYLPREDNISOLONE SODIUM SUCC 125 MG IJ SOLR
125.0000 mg | Freq: Once | INTRAMUSCULAR | Status: AC
  Administered 2014-06-05: 125 mg via INTRAVENOUS
  Filled 2014-06-05: qty 2

## 2014-06-05 MED ORDER — SODIUM CHLORIDE 0.9 % IV BOLUS (SEPSIS)
1000.0000 mL | Freq: Once | INTRAVENOUS | Status: AC
  Administered 2014-06-05: 1000 mL via INTRAVENOUS

## 2014-06-05 NOTE — Plan of Care (Signed)
Problem: ICU Phase Progression Outcomes Goal: O2 sats trending toward baseline Outcome: Progressing     

## 2014-06-05 NOTE — H&P (Signed)
[facility] History and Physical  Salina Aprilimmy L Rasor ZOX:[id number] DOB: 12/09/1960 DOA: 06/04/2014  Referring physician: EDP PCP: No primary care provider on file.   Chief Complaint: Cough, Fever   HPI: Dustin Randall is a 53 y.o. male who had been suffering with cough for the past 3 months.  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  Symptoms have been worsening, he hasnt tried any specific treatment for his fever.  His wife was finally able to convince him to come to the ED to get checked out (had previously been refusing medical care for the ongoing cough symptoms).  Review of Systems: Systems reviewed.  As above, otherwise negative  Past Medical History  Diagnosis Date  . Kidney stones   . Stroke   . Hypertension   . Depression   . Pneumonia    Past Surgical History  Procedure Laterality Date  . Joint replacement    . Knee surgery    . Hernia repair    . Cystoscopy    . Hip replacements     Social History:  reports that he has never smoked. He has never used smokeless tobacco. He reports that he does not drink alcohol or use illicit drugs.  No Known Allergies  History reviewed. No pertinent family history.   Prior to Admission medications   Medication Sig Start Date End Date Taking? Authorizing Provider  buPROPion (WELLBUTRIN) 100 MG tablet Take by mouth daily.      Historical Provider, MD   Physical Exam: Filed Vitals:   06/05/14 0402  BP: 99/73  Pulse: 94  Temp: 98.8 F (37.1 C)  Resp: 18    BP 99/73 mmHg  Pulse 94  Temp(Src) 98.8 F (37.1 C) (Oral)  Resp 18  Ht 5\' 9"  (1.753 m)  Wt 91.717 kg (202 lb 3.2 oz)  BMI 29.85 kg/m2  SpO2 97%  General Appearance:    Alert, oriented, no distress, appears stated age  Head:    Normocephalic, atraumatic  Eyes:    PERRL, EOMI,  sclera non-icteric        Nose:   Nares without drainage or epistaxis. Mucosa, turbinates normal  Throat:   Moist mucous membranes. Oropharynx without erythema or exudate.  Neck:   Supple. No carotid bruits.  No thyromegaly.  No lymphadenopathy.   Back:     No CVA tenderness, no spinal tenderness  Lungs:     Clear to auscultation bilaterally, without wheezes, rhonchi or rales  Chest wall:    No tenderness to palpitation  Heart:    Regular rate and rhythm without murmurs, gallops, rubs  Abdomen:     Soft, non-tender, nondistended, normal bowel sounds, no organomegaly  Genitalia:    deferred  Rectal:    deferred  Extremities:   No clubbing, cyanosis or edema.  Pulses:   2+ and symmetric all extremities  Skin:   Skin color, texture, turgor normal, no rashes or lesions  Lymph nodes:   Cervical, supraclavicular, and axillary nodes normal  Neurologic:   CNII-XII intact. Normal strength, sensation and reflexes      throughout    Labs on Admission:  Basic Metabolic Panel:  Recent Labs Lab 06/04/14 2231  NA 141  K 3.8  CL 103  CO2 23  GLUCOSE 127*  BUN 17  CREATININE 1.10  CALCIUM 8.8   Liver Function Tests: No results for input(s): AST, ALT, ALKPHOS, BILITOT, PROT, ALBUMIN in the last 168 hours. No results for input(s): LIPASE, AMYLASE in the last 168 hours. No results for input(s): AMMONIA in the last 168 hours. CBC:  Recent Labs Lab 06/04/14 2231  WBC 13.3*  NEUTROABS 9.9*  HGB 13.2  HCT 39.1  MCV 90.7  PLT 222   Cardiac Enzymes: No results for input(s): CKTOTAL, CKMB, CKMBINDEX, TROPONINI in the last 168 hours.  BNP (last 3 results) No results for input(s): PROBNP in the last 8760 hours. CBG: No results for input(s): GLUCAP in the last 168 hours.  Radiological Exams on Admission: Dg Chest 2 View  06/04/2014   CLINICAL DATA:  Coughing for 1 month, fever, congestion  EXAM: CHEST  2 VIEW  COMPARISON:  11/25/2009  FINDINGS: The heart size and mediastinal contours  are within normal limits. Both lungs are clear. The visualized skeletal structures are unremarkable.  IMPRESSION: No active cardiopulmonary disease.   Electronically Signed   By: Elige KoHetal  Patel   On: 06/04/2014 22:36   Ct Angio Chest Pe W/cm &/or Wo Cm  06/05/2014   CLINICAL DATA:  Fever, cough, sore throat, hemoptysis. History of hypertension.  EXAM: CT ANGIOGRAPHY CHEST WITH CONTRAST  TECHNIQUE: Multidetector CT imaging of the chest was performed using the standard protocol during bolus administration of intravenous contrast. Multiplanar CT image reconstructions and MIPs were obtained to evaluate the vascular anatomy.  CONTRAST:  100mL OMNIPAQUE IOHEXOL 350 MG/ML SOLN  COMPARISON:  07/22/2010  FINDINGS: Technically adequate study with moderately good opacification of the central and segmental pulmonary arteries. No focal filling defects are demonstrated. No evidence of significant pulmonary embolus.  Normal heart size. Normal caliber thoracic aorta. No evidence of aortic dissection allowing for motion artifact. Great vessel origins are patent. Esophagus is decompressed. No significant lymphadenopathy in the chest.  Atelectasis or infiltration is demonstrated in both costophrenic angles posteriorly. With focal consolidation and with some mucous plugging demonstrated in the right lung base. Mild bronchiectasis. No pleural effusions. No pneumothorax.  Included portions of the upper abdominal organs are grossly unremarkable. Mild degenerative changes in the thoracic spine.  Review of the MIP images confirms the above findings.  IMPRESSION: No evidence of significant pulmonary embolus.  Infiltration or consolidation in the lung bases associated with Mild bronchiectasis and mucous plugging.   Electronically Signed   By: Burman NievesWilliam  Stevens M.D.   On: 06/05/2014 00:37    EKG: Independently reviewed.  Assessment/Plan Principal Problem:   CAP (community acquired pneumonia) Active Problems:   Bronchiectasis    Chronic bronchitis   Sepsis   1. CAP causing sepsis - superimposed on what sounds like a new diagnosis of chronic bronchitis 1. Rocephin and azithromycin 2. CAP pathway 3. Cultures pending 4. Tele monitor and o2 sat monitoring 5. O2 as needed 6. Tylenol PRN fever 7. NS at 75 cc/hr 2. Chronic bronchitis - new diagnosis, sounds like he has had bronchitis for the past 3 months before developing CAP within the past day or so. 1. Prednisone 2. Unclear what his baselien respiratory function is, will likely need pulmonology follow up after his CAP is cleared up. 3. Adult wheeze protocol    Code Status: Full Code  Family Communication: Wife at bedside Disposition Plan: Admit to inpatient   Time spent: 70 min  GARDNER, JARED M. [facility] Pager [phone]  If 7AM-7PM, please contact the day team taking care of the patient [email or web address] Password [email or web address]  06/05/2014, 4:25 AM

## 2014-06-05 NOTE — Progress Notes (Signed)
Pharmacy consult to adjust antibiotics for renal function. Currently on azithromycin and ceftriaxone.  Neither require adjustment for renal function. Pharmacy will sign off.  Please advise if we can be of further assistance. Thanks you, Talbert CageLora Lilya Smitherman, PharmD

## 2014-06-05 NOTE — Progress Notes (Signed)
UR Completed.  [phone]  

## 2014-06-05 NOTE — Plan of Care (Signed)
Problem: ICU Phase Progression Outcomes Goal: Dyspnea controlled at rest Outcome: Completed/Met Date Met:  06/05/14 Goal: Hemodynamically stable Outcome: Completed/Met Date Met:  06/05/14 Goal: Pain controlled with appropriate interventions Outcome: Completed/Met Date Met:  06/05/14

## 2014-06-05 NOTE — Plan of Care (Addendum)
53 yo M with fever, cough, congestion, found to have CAP.  H/o "COPD" but no formal PFTs done.  Was desating down into the upper 80s so now on oxygen, going to inpatient tele bed.  Azithromycin and rocephin on board, also getting dose of steroids as he was wheezy earlier before breathing treatments.

## 2014-06-05 NOTE — Progress Notes (Signed)
53 YEAR OLD male admitted for community acquired pneumonia. He was started on IV rocephin and IV zithromax. Continue to monitor.  On exam.  He is alert oriented and comfortable.  CVS s1s2, no mrg Lungs good air entry bilateral no wheezing.  Gastrointestinal: abdomen soft NT ND BS+ Extremities: no pedal edema.   No new changes in the plan .    Kathlen ModyVijaya Shenoa Hattabaugh, MD [phone]

## 2014-06-06 LAB — LEGIONELLA ANTIGEN, URINE

## 2014-06-06 LAB — BASIC METABOLIC PANEL
Anion gap: 15 (ref 5–15)
BUN: 26 mg/dL — AB (ref 6–23)
CALCIUM: 9.1 mg/dL (ref 8.4–10.5)
CO2: 21 mEq/L (ref 19–32)
CREATININE: 0.81 mg/dL (ref 0.50–1.35)
Chloride: 107 mEq/L (ref 96–112)
GFR calc Af Amer: >90 mL/min (ref >90)
GLUCOSE: 114 mg/dL — AB (ref 70–99)
Potassium: 3.5 mEq/L — ABNORMAL LOW (ref 3.7–5.3)
Sodium: 143 mEq/L (ref 137–147)

## 2014-06-06 MED ORDER — BENZONATATE 100 MG PO CAPS
200.0000 mg | ORAL_CAPSULE | Freq: Three times a day (TID) | ORAL | Status: DC | PRN
  Filled 2014-06-06: qty 2

## 2014-06-06 MED ORDER — AZITHROMYCIN 500 MG PO TABS
500.0000 mg | ORAL_TABLET | Freq: Every day | ORAL | Status: DC
  Administered 2014-06-06 – 2014-06-07 (×2): 500 mg via ORAL
  Filled 2014-06-06 (×2): qty 1

## 2014-06-06 MED ORDER — IPRATROPIUM-ALBUTEROL 0.5-2.5 (3) MG/3ML IN SOLN
3.0000 mL | Freq: Four times a day (QID) | RESPIRATORY_TRACT | Status: DC
Start: 2014-06-06 — End: 2014-06-07
  Administered 2014-06-06 – 2014-06-07 (×3): 3 mL via RESPIRATORY_TRACT
  Filled 2014-06-06 (×4): qty 3

## 2014-06-06 MED ORDER — POTASSIUM CHLORIDE CRYS ER 20 MEQ PO TBCR
40.0000 meq | EXTENDED_RELEASE_TABLET | Freq: Once | ORAL | Status: AC
  Administered 2014-06-06: 40 meq via ORAL
  Filled 2014-06-06: qty 2

## 2014-06-06 MED ORDER — GUAIFENESIN 100 MG/5ML PO SYRP
200.0000 mg | ORAL_SOLUTION | ORAL | Status: DC | PRN
  Administered 2014-06-06: 200 mg via ORAL
  Filled 2014-06-06 (×2): qty 10

## 2014-06-06 NOTE — Plan of Care (Signed)
Problem: Phase I Progression Outcomes Goal: Dyspnea controlled at rest Outcome: Completed/Met Date Met:  06/06/14 Goal: Pain controlled with appropriate interventions Outcome: Completed/Met Date Met:  06/06/14 Goal: OOB as tolerated unless otherwise ordered Outcome: Completed/Met Date Met:  06/06/14 Goal: First antibiotic given within 6hrs of admit Outcome: Completed/Met Date Met:  06/06/14 Goal: Confirm chest x-ray completed Outcome: Completed/Met Date Met:  06/06/14 Goal: Voiding-avoid urinary catheter unless indicated Outcome: Completed/Met Date Met:  06/06/14  Problem: Phase II Progression Outcomes Goal: Encourage coughing & deep breathing Outcome: Completed/Met Date Met:  06/06/14 Goal: Wean O2 if indicated Outcome: Not Applicable Date Met:  84/73/08 Goal: Pain controlled Outcome: Completed/Met Date Met:  06/06/14 Goal: Progress activity as tolerated unless otherwise ordered Outcome: Completed/Met Date Met:  06/06/14 Goal: Tolerating diet Outcome: Completed/Met Date Met:  06/06/14

## 2014-06-06 NOTE — Plan of Care (Signed)
Problem: Phase I Progression Outcomes Goal: Initial discharge plan identified Outcome: Completed/Met Date Met:  06/06/14 Goal: Hemodynamically stable Outcome: Completed/Met Date Met:  06/06/14

## 2014-06-06 NOTE — Progress Notes (Signed)
[facility] PROGRESS NOTE  Dustin Randall HQI:[id number] DOB: 03/06/1961 DOA: 06/04/2014 PCP: No primary care provider on file.  Assessment/Plan: 1. Community acquired pneumonia: Started on IV rocephin and zithromax. Cough medication, will order bronchodilators.  Good oxygen sats on RA. Sputum cultures show normal oropharyngeal flora.  Blood cultures are so far negative .  Outpatient follow up with pulmonary .    Hypokalemia replete as needed.     Code Status: full code.  Family Communication:none at bedside Disposition Plan: pending.    Consultants:  none  Procedures:  none  Antibiotics:  zithromax  Rocephin.   HPI/Subjective: Still coughing, flushed.   Objective: Filed Vitals:   06/06/14 1330  BP: 129/77  Pulse: 96  Temp: 99.6 F (37.6 C)  Resp: 17    Intake/Output Summary (Last 24 hours) at 06/06/14 1607 Last data filed at 06/06/14 1230  Gross per 24 hour  Intake    240 ml  Output    501 ml  Net   -261 ml   Filed Weights   06/04/14 2202 06/05/14 0402 06/05/14 0411  Weight: 92.987 kg (205 lb) 91.4 kg (201 lb 8 oz) 91.717 kg (202 lb 3.2 oz)    Exam:   General:  Alert afebrile comfortable  Cardiovascular: s1s2  Respiratory: ctab  Abdomen: soft NT ND bs+  Musculoskeletal:  No pedal edema.   Data Reviewed: Basic Metabolic Panel:  Recent Labs Lab 06/04/14 2231 06/06/14 0501  NA 141 143  K 3.8 3.5*  CL 103 107  CO2 23 21  GLUCOSE 127* 114*  BUN 17 26*  CREATININE 1.10 0.81  CALCIUM 8.8 9.1   Liver Function Tests: No results for input(s): AST, ALT, ALKPHOS, BILITOT, PROT, ALBUMIN in the last 168 hours. No results for input(s): LIPASE, AMYLASE in the last 168 hours. No results for input(s): AMMONIA in the last 168 hours. CBC:  Recent Labs Lab 06/04/14 2231  WBC 13.3*  NEUTROABS 9.9*  HGB 13.2  HCT 39.1  MCV 90.7  PLT 222   Cardiac Enzymes: No results for input(s): CKTOTAL, CKMB, CKMBINDEX, TROPONINI in the last  168 hours. BNP (last 3 results) No results for input(s): PROBNP in the last 8760 hours. CBG: No results for input(s): GLUCAP in the last 168 hours.  Recent Results (from the past 240 hour(s))  Culture, blood (routine x 2) Call MD if unable to obtain prior to antibiotics being given     Status: None (Preliminary result)   Collection Time: 06/05/14  4:32 AM  Result Value Ref Range Status   Specimen Description BLOOD LEFT ARM  Final   Special Requests BOTTLES DRAWN AEROBIC ONLY 4CC  Final   Culture  Setup Time   Final    06/05/2014 08:51 Performed at [facility]    Culture   Final           BLOOD CULTURE RECEIVED NO GROWTH TO DATE CULTURE WILL BE HELD FOR 5 DAYS BEFORE ISSUING A FINAL NEGATIVE REPORT Performed at [facility]    Report Status PENDING  Incomplete  Culture, sputum-assessment     Status: None   Collection Time: 06/05/14  4:33 AM  Result Value Ref Range Status   Specimen Description SPUTUM  Final   Special Requests NONE  Final   Sputum evaluation   Final    THIS SPECIMEN IS ACCEPTABLE. RESPIRATORY CULTURE REPORT TO FOLLOW.   Report Status 06/05/2014 FINAL  Final  Culture, respiratory (NON-Expectorated)     Status: None (  Preliminary result)   Collection Time: 06/05/14  4:33 AM  Result Value Ref Range Status   Specimen Description SPUTUM  Final   Special Requests ADDED 0542  Final   Gram Stain   Final    ABUNDANT WBC PRESENT, PREDOMINANTLY PMN MODERATE SQUAMOUS EPITHELIAL CELLS PRESENT ABUNDANT GRAM POSITIVE COCCI IN PAIRS MODERATE GRAM NEGATIVE RODS FEW GRAM POSITIVE RODS    Culture   Final    NORMAL OROPHARYNGEAL FLORA Performed at [facility]    Report Status PENDING  Incomplete  Culture, blood (routine x 2) Call MD if unable to obtain prior to antibiotics being given     Status: None (Preliminary result)   Collection Time: 06/05/14  4:35 AM  Result Value Ref Range Status   Specimen Description BLOOD RIGHT HAND  Final   Special  Requests   Final    BOTTLES DRAWN AEROBIC AND ANAEROBIC 10CC BLUE 6CC PUPRLE   Culture  Setup Time   Final    06/05/2014 08:51 Performed at [facility]    Culture   Final           BLOOD CULTURE RECEIVED NO GROWTH TO DATE CULTURE WILL BE HELD FOR 5 DAYS BEFORE ISSUING A FINAL NEGATIVE REPORT Performed at [facility]    Report Status PENDING  Incomplete     Studies: Dg Chest 2 View  06/04/2014   CLINICAL DATA:  Coughing for 1 month, fever, congestion  EXAM: CHEST  2 VIEW  COMPARISON:  11/25/2009  FINDINGS: The heart size and mediastinal contours are within normal limits. Both lungs are clear. The visualized skeletal structures are unremarkable.  IMPRESSION: No active cardiopulmonary disease.   Electronically Signed   By: Elige KoHetal  Patel   On: 06/04/2014 22:36   Ct Angio Chest Pe W/cm &/or Wo Cm  06/05/2014   CLINICAL DATA:  Fever, cough, sore throat, hemoptysis. History of hypertension.  EXAM: CT ANGIOGRAPHY CHEST WITH CONTRAST  TECHNIQUE: Multidetector CT imaging of the chest was performed using the standard protocol during bolus administration of intravenous contrast. Multiplanar CT image reconstructions and MIPs were obtained to evaluate the vascular anatomy.  CONTRAST:  100mL OMNIPAQUE IOHEXOL 350 MG/ML SOLN  COMPARISON:  07/22/2010  FINDINGS: Technically adequate study with moderately good opacification of the central and segmental pulmonary arteries. No focal filling defects are demonstrated. No evidence of significant pulmonary embolus.  Normal heart size. Normal caliber thoracic aorta. No evidence of aortic dissection allowing for motion artifact. Great vessel origins are patent. Esophagus is decompressed. No significant lymphadenopathy in the chest.  Atelectasis or infiltration is demonstrated in both costophrenic angles posteriorly. With focal consolidation and with some mucous plugging demonstrated in the right lung base. Mild bronchiectasis. No pleural effusions. No  pneumothorax.  Included portions of the upper abdominal organs are grossly unremarkable. Mild degenerative changes in the thoracic spine.  Review of the MIP images confirms the above findings.  IMPRESSION: No evidence of significant pulmonary embolus.  Infiltration or consolidation in the lung bases associated with Mild bronchiectasis and mucous plugging.   Electronically Signed   By: Burman NievesWilliam  Stevens M.D.   On: 06/05/2014 00:37    Scheduled Meds: . azithromycin  500 mg Oral Daily  . buPROPion  100 mg Oral Daily  . cefTRIAXone (ROCEPHIN)  IV  1 g Intravenous Q24H  . heparin  5,000 Units Subcutaneous 3 times per day  . predniSONE  40 mg Oral Q breakfast   Continuous Infusions:   Principal Problem:  CAP (community acquired pneumonia) Active Problems:   Bronchiectasis   Chronic bronchitis   Sepsis    Time spent: 15 min    Brenya Taulbee  [facility] Pager [phone]. If 7PM-7AM, please contact night-coverage at [email or web address], password [facility] 06/06/2014, 4:07 PM  LOS: 2 days

## 2014-06-07 DIAGNOSIS — E876 Hypokalemia: Secondary | ICD-10-CM | POA: Diagnosis not present

## 2014-06-07 DIAGNOSIS — I10 Essential (primary) hypertension: Secondary | ICD-10-CM | POA: Diagnosis present

## 2014-06-07 LAB — BASIC METABOLIC PANEL
ANION GAP: 12 (ref 5–15)
BUN: 23 mg/dL (ref 6–23)
CO2: 25 mEq/L (ref 19–32)
Calcium: 8.8 mg/dL (ref 8.4–10.5)
Chloride: 109 mEq/L (ref 96–112)
Creatinine, Ser: 0.87 mg/dL (ref 0.50–1.35)
GFR calc Af Amer: >90 mL/min (ref >90)
GFR calc non Af Amer: >90 mL/min (ref >90)
Glucose, Bld: 94 mg/dL (ref 70–99)
Potassium: 3.2 mEq/L — ABNORMAL LOW (ref 3.7–5.3)
Sodium: 146 mEq/L (ref 137–147)

## 2014-06-07 LAB — CBC
HCT: 38.1 % — ABNORMAL LOW (ref 39.0–52.0)
Hemoglobin: 12.4 g/dL — ABNORMAL LOW (ref 13.0–17.0)
MCH: 29.4 pg (ref 26.0–34.0)
MCHC: 32.5 g/dL (ref 30.0–36.0)
MCV: 90.3 fL (ref 78.0–100.0)
PLATELETS: 206 10*3/uL (ref 150–400)
RBC: 4.22 MIL/uL (ref 4.22–5.81)
RDW: 13.8 % (ref 11.5–15.5)
WBC: 10.4 10*3/uL (ref 4.0–10.5)

## 2014-06-07 LAB — CULTURE, RESPIRATORY W GRAM STAIN

## 2014-06-07 LAB — CULTURE, RESPIRATORY: CULTURE: NORMAL

## 2014-06-07 MED ORDER — BENZONATATE 200 MG PO CAPS
200.0000 mg | ORAL_CAPSULE | Freq: Three times a day (TID) | ORAL | Status: DC | PRN
Start: 2014-06-07 — End: 2014-07-22

## 2014-06-07 MED ORDER — POTASSIUM CHLORIDE CRYS ER 20 MEQ PO TBCR
40.0000 meq | EXTENDED_RELEASE_TABLET | Freq: Two times a day (BID) | ORAL | Status: DC
  Administered 2014-06-07: 40 meq via ORAL
  Filled 2014-06-07: qty 2

## 2014-06-07 MED ORDER — PREDNISONE 20 MG PO TABS: ORAL_TABLET | ORAL | Status: DC

## 2014-06-07 MED ORDER — IPRATROPIUM-ALBUTEROL 0.5-2.5 (3) MG/3ML IN SOLN: 3.0000 mL | Freq: Four times a day (QID) | RESPIRATORY_TRACT | Status: DC | PRN

## 2014-06-07 MED ORDER — INFLUENZA VAC SPLIT QUAD 0.5 ML IM SUSY: 0.5000 mL | PREFILLED_SYRINGE | INTRAMUSCULAR | Status: DC

## 2014-06-07 MED ORDER — POTASSIUM CHLORIDE CRYS ER 20 MEQ PO TBCR: 40.0000 meq | EXTENDED_RELEASE_TABLET | Freq: Two times a day (BID) | ORAL | Status: DC

## 2014-06-07 MED ORDER — LEVOFLOXACIN 750 MG PO TABS: 750.0000 mg | ORAL_TABLET | Freq: Every day | ORAL | Status: DC

## 2014-06-07 MED ORDER — GUAIFENESIN 100 MG/5ML PO SYRP: 200.0000 mg | ORAL_SOLUTION | ORAL | Status: DC | PRN

## 2014-06-07 NOTE — Discharge Summary (Signed)
Physician Discharge Summary  [name] UEA:[id number] DOB: 08/19/1960 DOA: 06/04/2014  PCP: No primary care provider on file.  Admit date: 06/04/2014 Discharge date: 06/07/2014  Time spent: 30 minutes  Recommendations for Outpatient Follow-up:  1. Follow up with PCP in one week 2. Please check potassium level in one week 3. Follow up with pulmonology as outpatient for PFT'S and COPD.   Discharge Diagnoses:  Principal Problem:   CAP (community acquired pneumonia) Active Problems:   Bronchiectasis   Chronic bronchitis   Sepsis hypertension.   Discharge Condition: much improve.d    Diet recommendation: low sodium diet.   Filed Weights   06/05/14 0402 06/05/14 0411 06/07/14 0625  Weight: 91.4 kg (201 lb 8 oz) 91.717 kg (202 lb 3.2 oz) 91.2 kg (201 lb 1 oz)    History of present illness:  Dustin Randall is a 53 y.o. male who had been suffering with cough for the past 3 months. 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 He was admitted for CAP and was treated with IV antibiotics . He has clinically improved and will need outpatient pulm follow up for PFTS.   Hospital Course:  Community acquired pneumonia with bronchitis causing sepsis on admission: Admitted for IV antibiotics, completed 3 days , bronchodilators for wheezing. Plan to discharge today with oral antibiotics to complete the course. Good oxygen sats on RA. Sputum cultures show normal oropharyngeal flora.  Blood cultures are so far negative .  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  He might have a component of copd, though never diagnosed. He also has changes of mild bronchiectasis on his CT chest.    Hypertension: controlled resume home meds  Hypokalemia: Replete as needed.    Procedures:  none  Consultations:  none  Discharge Exam: Filed Vitals:   06/07/14 0625  BP: 121/76  Pulse: 78  Temp: 99 F (37.2 C)  Resp: 18    General: alert afebrile comfortable Cardiovascular: s1s2 Respiratory: ctab, no wheezing or rhonchi.   Discharge Instructions You were cared for by a hospitalist during your hospital stay. If you have any questions about your discharge medications or the care you received while you were in the hospital after you are discharged, you can call the unit and asked to speak with the hospitalist on call if the hospitalist that took care of you is not available. Once you are discharged, your primary care physician will handle any further medical issues. Please note that NO REFILLS for any discharge medications will be authorized once you are discharged, as it is imperative that you return to your primary care physician (or establish a relationship with a primary care physician if you do not have one) for your aftercare needs so that they can reassess your need for medications and monitor your lab values.  Discharge Instructions    Diet - low sodium heart healthy    Complete by:  As directed      Discharge instructions    Complete by:  As directed   Follow up with pulmonology in 2 weeks for pulmonary function tests.  follo wup with PCP in one week.  Please check your potassium level at your PCP office.          Current Discharge Medication List  START taking these medications   Details  benzonatate (TESSALON) 200 MG capsule Take 1 capsule (200 mg total) by mouth 3 (three) times daily as needed for cough. Qty: 20 capsule, Refills: 0    guaifenesin (ROBITUSSIN) 100 MG/5ML syrup Take 10 mLs (200 mg total) by mouth every 4 (four) hours as needed for congestion. Qty: 120 mL, Refills: 0    ipratropium-albuterol (DUONEB) 0.5-2.5 (3) MG/3ML SOLN Take 3 mLs by nebulization every 6 (six) hours as needed. Qty: 360 mL, Refills: 2     levofloxacin (LEVAQUIN) 750 MG tablet Take 1 tablet (750 mg total) by mouth daily. Qty: 5 tablet, Refills: 0    potassium chloride SA (K-DUR,KLOR-CON) 20 MEQ tablet Take 2 tablets (40 mEq total) by mouth 2 (two) times daily. Qty: 2 tablet, Refills: 0    predniSONE (DELTASONE) 20 MG tablet Prednisone 20 mg daily for 3 more days and stop. Qty: 3 tablet, Refills: 0      CONTINUE these medications which have NOT CHANGED   Details  albuterol (PROVENTIL HFA;VENTOLIN HFA) 108 (90 BASE) MCG/ACT inhaler Inhale 1 puff into the lungs every 6 (six) hours as needed for wheezing or shortness of breath.    albuterol (PROVENTIL) (2.5 MG/3ML) 0.083% nebulizer solution Take 2.5 mg by nebulization every 6 (six) hours as needed for wheezing or shortness of breath.    Ascorbic Acid (VITAMIN C) 1000 MG tablet Take 1,000 mg by mouth daily.    buPROPion (WELLBUTRIN XL) 150 MG 24 hr tablet Take 150 mg by mouth 2 (two) times daily.    cholecalciferol (VITAMIN D) 1000 UNITS tablet Take 1,000 Units by mouth daily.    fexofenadine-pseudoephedrine (ALLEGRA-D 24) 180-240 MG per 24 hr tablet Take 1 tablet by mouth daily.    fluticasone (FLONASE) 50 MCG/ACT nasal spray Place 1 spray into both nostrils 2 (two) times daily as needed for allergies or rhinitis.    losartan (COZAAR) 25 MG tablet Take 25 mg by mouth daily.    Multiple Vitamin (MULTIVITAMIN WITH MINERALS) TABS tablet Take 1 tablet by mouth daily.    Omega 3 1200 MG CAPS Take 2,400 mg by mouth daily.     Pitavastatin Calcium (LIVALO) 4 MG TABS Take 4 mg by mouth daily.      STOP taking these medications     meloxicam (MOBIC) 15 MG tablet      vitamin E 400 UNIT capsule      buPROPion (WELLBUTRIN) 100 MG tablet        No Known Allergies Follow-up Information    Follow up with [facility]. Schedule an appointment as soon as possible for a visit in 2 weeks.   Specialty:  Pulmonology   Why:  for pulmonary function testing.     Contact information:   [address] [place] [address] [phone]       The results of significant diagnostics from this hospitalization (including imaging, microbiology, ancillary and laboratory) are listed below for reference.    Significant Diagnostic Studies: Dg Chest 2 View  06/04/2014   CLINICAL DATA:  Coughing for 1 month, fever, congestion  EXAM: CHEST  2 VIEW  COMPARISON:  11/25/2009  FINDINGS: The heart size and mediastinal contours are within normal limits. Both lungs are clear. The visualized skeletal structures are unremarkable.  IMPRESSION: No active cardiopulmonary disease.   Electronically Signed   By: Elige Ko   On: 06/04/2014 22:36   Ct Angio Chest Pe W/cm &/or Wo Cm  06/05/2014  CLINICAL DATA:  Fever, cough, sore throat, hemoptysis. History of hypertension.  EXAM: CT ANGIOGRAPHY CHEST WITH CONTRAST  TECHNIQUE: Multidetector CT imaging of the chest was performed using the standard protocol during bolus administration of intravenous contrast. Multiplanar CT image reconstructions and MIPs were obtained to evaluate the vascular anatomy.  CONTRAST:  100mL OMNIPAQUE IOHEXOL 350 MG/ML SOLN  COMPARISON:  07/22/2010  FINDINGS: Technically adequate study with moderately good opacification of the central and segmental pulmonary arteries. No focal filling defects are demonstrated. No evidence of significant pulmonary embolus.  Normal heart size. Normal caliber thoracic aorta. No evidence of aortic dissection allowing for motion artifact. Great vessel origins are patent. Esophagus is decompressed. No significant lymphadenopathy in the chest.  Atelectasis or infiltration is demonstrated in both costophrenic angles posteriorly. With focal consolidation and with some mucous plugging demonstrated in the right lung base. Mild bronchiectasis. No pleural effusions. No pneumothorax.  Included portions of the upper abdominal organs are grossly unremarkable. Mild degenerative  changes in the thoracic spine.  Review of the MIP images confirms the above findings.  IMPRESSION: No evidence of significant pulmonary embolus.  Infiltration or consolidation in the lung bases associated with Mild bronchiectasis and mucous plugging.   Electronically Signed   By: Burman NievesWilliam  Stevens M.D.   On: 06/05/2014 00:37    Microbiology: Recent Results (from the past 240 hour(s))  Culture, blood (routine x 2) Call MD if unable to obtain prior to antibiotics being given     Status: None (Preliminary result)   Collection Time: 06/05/14  4:32 AM  Result Value Ref Range Status   Specimen Description BLOOD LEFT ARM  Final   Special Requests BOTTLES DRAWN AEROBIC ONLY 4CC  Final   Culture  Setup Time   Final    06/05/2014 08:51 Performed at [facility]    Culture   Final           BLOOD CULTURE RECEIVED NO GROWTH TO DATE CULTURE WILL BE HELD FOR 5 DAYS BEFORE ISSUING A FINAL NEGATIVE REPORT Performed at [facility]    Report Status PENDING  Incomplete  Culture, sputum-assessment     Status: None   Collection Time: 06/05/14  4:33 AM  Result Value Ref Range Status   Specimen Description SPUTUM  Final   Special Requests NONE  Final   Sputum evaluation   Final    THIS SPECIMEN IS ACCEPTABLE. RESPIRATORY CULTURE REPORT TO FOLLOW.   Report Status 06/05/2014 FINAL  Final  Culture, respiratory (NON-Expectorated)     Status: None   Collection Time: 06/05/14  4:33 AM  Result Value Ref Range Status   Specimen Description SPUTUM  Final   Special Requests ADDED 0542  Final   Gram Stain   Final    ABUNDANT WBC PRESENT, PREDOMINANTLY PMN MODERATE SQUAMOUS EPITHELIAL CELLS PRESENT ABUNDANT GRAM POSITIVE COCCI IN PAIRS MODERATE GRAM NEGATIVE RODS FEW GRAM POSITIVE RODS    Culture   Final    NORMAL OROPHARYNGEAL FLORA Performed at [facility]    Report Status 06/07/2014 FINAL  Final  Culture, blood (routine x 2) Call MD if unable to obtain prior to antibiotics being  given     Status: None (Preliminary result)   Collection Time: 06/05/14  4:35 AM  Result Value Ref Range Status   Specimen Description BLOOD RIGHT HAND  Final   Special Requests   Final    BOTTLES DRAWN AEROBIC AND ANAEROBIC 10CC BLUE 6CC PUPRLE   Culture  Setup Time  Final    06/05/2014 08:51 Performed at [facility]    Culture   Final           BLOOD CULTURE RECEIVED NO GROWTH TO DATE CULTURE WILL BE HELD FOR 5 DAYS BEFORE ISSUING A FINAL NEGATIVE REPORT Performed at [facility]    Report Status PENDING  Incomplete     Labs: Basic Metabolic Panel:  Recent Labs Lab 06/04/14 2231 06/06/14 0501 06/07/14 0529  NA 141 143 146  K 3.8 3.5* 3.2*  CL 103 107 109  CO2 23 21 25   GLUCOSE 127* 114* 94  BUN 17 26* 23  CREATININE 1.10 0.81 0.87  CALCIUM 8.8 9.1 8.8   Liver Function Tests: No results for input(s): AST, ALT, ALKPHOS, BILITOT, PROT, ALBUMIN in the last 168 hours. No results for input(s): LIPASE, AMYLASE in the last 168 hours. No results for input(s): AMMONIA in the last 168 hours. CBC:  Recent Labs Lab 06/04/14 2231 06/07/14 0529  WBC 13.3* 10.4  NEUTROABS 9.9*  --   HGB 13.2 12.4*  HCT 39.1 38.1*  MCV 90.7 90.3  PLT 222 206   Cardiac Enzymes: No results for input(s): CKTOTAL, CKMB, CKMBINDEX, TROPONINI in the last 168 hours. BNP: BNP (last 3 results) No results for input(s): PROBNP in the last 8760 hours. CBG: No results for input(s): GLUCAP in the last 168 hours.     SignedKathlen Mody  [facility] 06/07/2014, 10:53 AM

## 2014-06-07 NOTE — Plan of Care (Signed)
Problem: Phase II Progression Outcomes Goal: Discharge plan established Outcome: Completed/Met Date Met:  06/07/14

## 2014-06-07 NOTE — Care Management Note (Signed)
    Page 1 of 1   06/07/2014     12:53:58 PM CARE MANAGEMENT NOTE 06/07/2014  Patient:  Dustin Randall,Dustin Randall   Account Number:  [id number]  Date Initiated:  06/07/2014  Documentation initiated by:  Dustin Randall,Dustin Randall  Subjective/Objective Assessment:   Pt admitted with PNA     Action/Plan:   PTA pt lived at home with wife   Anticipated DC Date:  06/07/2014   Anticipated DC Plan:  HOME/SELF CARE      DC Planning Services  CM consult      Choice offered to / List presented to:             Status of service:  Completed, signed off Medicare Important Message given?  NO (If response is "NO", the following Medicare IM given date fields will be blank) Date Medicare IM given:   Medicare IM given by:   Date Additional Medicare IM given:   Additional Medicare IM given by:    Discharge Disposition:  HOME/SELF CARE  Per UR Regulation:  Reviewed for med. necessity/level of care/duration of stay  If discussed at Long Length of Stay Meetings, dates discussed:    Comments:  06/07/14- 1145- Dustin PieriniKristi Bevin Mayall RN, BSN [phone] Referral for PCP needs- spoke with pt who states that his PCP is Deforest HoylesDaniel Gentry in [place]- phone [phone]- call made by unit secretary and appointment made for Dec. 15 at 1030 with Molinda BailiffErnest Smith PA for Dr. Littie DeedsGentry.

## 2014-06-07 NOTE — Progress Notes (Signed)
06/07/2014 2:02 PM Nursing note Discharge avs form, medications already taken today and those due this evening given and explained to patient and family. rx given and reviewed with patient. Follow up appointments and when to call MD reviewed. D/c iv line. D/c tele. D/c home per orders.  [employer] Mcjunkins, Blanchard KelchStephanie Ingold

## 2014-06-11 LAB — CULTURE, BLOOD (ROUTINE X 2)
Culture: NO GROWTH
Culture: NO GROWTH

## 2014-06-18 ENCOUNTER — Other Ambulatory Visit (INDEPENDENT_AMBULATORY_CARE_PROVIDER_SITE_OTHER): Payer: BC Managed Care – PPO

## 2014-06-18 ENCOUNTER — Encounter: Payer: Self-pay | Admitting: Internal Medicine

## 2014-06-18 ENCOUNTER — Ambulatory Visit (INDEPENDENT_AMBULATORY_CARE_PROVIDER_SITE_OTHER): Payer: BC Managed Care – PPO | Admitting: Internal Medicine

## 2014-06-18 VITALS — BP 132/84 | HR 89 | Temp 98.9°F | Ht 69.0 in | Wt 203.0 lb

## 2014-06-18 DIAGNOSIS — J4530 Mild persistent asthma, uncomplicated: Secondary | ICD-10-CM | POA: Insufficient documentation

## 2014-06-18 DIAGNOSIS — J479 Bronchiectasis, uncomplicated: Secondary | ICD-10-CM

## 2014-06-18 LAB — CBC WITH DIFFERENTIAL/PLATELET
BASOS ABS: 0.1 10*3/uL (ref 0.0–0.1)
Basophils Relative: 0.6 % (ref 0.0–3.0)
Eosinophils Absolute: 0.4 10*3/uL (ref 0.0–0.7)
Eosinophils Relative: 3.6 % (ref 0.0–5.0)
HEMATOCRIT: 47.1 % (ref 39.0–52.0)
Hemoglobin: 15.4 g/dL (ref 13.0–17.0)
LYMPHS ABS: 3 10*3/uL (ref 0.7–4.0)
LYMPHS PCT: 27.8 % (ref 12.0–46.0)
MCHC: 32.6 g/dL (ref 30.0–36.0)
MCV: 91.8 fl (ref 78.0–100.0)
Monocytes Absolute: 0.9 10*3/uL (ref 0.1–1.0)
Monocytes Relative: 8.6 % (ref 3.0–12.0)
Neutro Abs: 6.3 10*3/uL (ref 1.4–7.7)
Neutrophils Relative %: 59.4 % (ref 43.0–77.0)
PLATELETS: 283 10*3/uL (ref 150.0–400.0)
RBC: 5.13 Mil/uL (ref 4.22–5.81)
RDW: 13.6 % (ref 11.5–15.5)
WBC: 10.6 10*3/uL — ABNORMAL HIGH (ref 4.0–10.5)

## 2014-06-18 MED ORDER — BUDESONIDE-FORMOTEROL FUMARATE 80-4.5 MCG/ACT IN AERO: INHALATION_SPRAY | RESPIRATORY_TRACT | Status: DC

## 2014-06-18 NOTE — Patient Instructions (Addendum)
Symbicort 80 Take 2 puffs first thing in am and then another 2 puffs about 12 hours later.   Only use your albuterol (proair) as a rescue medication to be used if you can't catch your breath by resting or doing a relaxed purse lip breathing pattern.  - The less you use it, the better it will work when you need it. - Ok to use up to 2 puffs  every 4 hours if you must but call for immediate appointment if use goes up over your usual need - Don't leave home without it !!  (think of it like the spare tire for your car)   GERD (REFLUX)  is an extremely common cause of respiratory symptoms just like yours , many times with no obvious heartburn at all.    It can be treated with medication, but also with lifestyle changes including avoidance of late meals, excessive alcohol, smoking cessation, and avoid fatty foods, chocolate, peppermint, colas, red wine, and acidic juices such as orange juice.  NO MINT OR MENTHOL PRODUCTS SO NO COUGH DROPS  USE SUGARLESS CANDY INSTEAD (Jolley ranchers or Stover's or Life Savers) or even ice chips will also do - the key is to swallow to prevent all throat clearing. NO OIL BASED VITAMINS - use powdered substitutes.  Try prilosec 20mg  Take 30-60 min before first meal of the day and Pepcid 20 mg one at  bedtime until return   Please remember to go to the lab department downstairs for your tests - we will call you with the results when they are available.  Please schedule a follow up office visit in 4 weeks, sooner if needed with pfts and cxr on return

## 2014-06-18 NOTE — Assessment & Plan Note (Signed)
DDX of  difficult airways management all start with A and  include Adherence, Ace Inhibitors, Acid Reflux, Active Sinus Disease, Alpha 1 Antitripsin deficiency, Anxiety masquerading as Airways dz,  ABPA,  allergy(esp in young), Aspiration (esp in elderly), Adverse effects of DPI,  Active smokers, plus two Bs  = Bronchiectasis and Beta blocker use..and one C= CHF  Adherence is always the initial "prime suspect" and is a multilayered concern that requires a "trust but verify" approach in every patient - starting with knowing how to use medications, especially inhalers, correctly, keeping up with refills and understanding the fundamental difference between maintenance and prns vs those medications only taken for a very short course and then stopped and not refilled.  06/18/2014 p extensive coaching HFA effectiveness =    75% so try symbicort at only the 80 to reduce risk of aggravating cough  ? Acid (or non-acid) GERD > always difficult to exclude as up to 75% of pts in some series report no assoc GI/ Heartburn symptoms> rec max (24h)  acid suppression and diet restrictions/ reviewed and instructions given in writing.   ? Allergy/ occupational asthma or cough irritant asthma > minimize exposure as much as possible, advised  Bronchiectasis > discussed separately     Each maintenance medication was reviewed in detail including most importantly the difference between maintenance and as needed and under what circumstances the prns are to be used.  Please see instructions for details which were reviewed in writing and the patient given a copy.

## 2014-06-18 NOTE — Assessment & Plan Note (Signed)
Not clear when this developed historically but it could be why he keeps getting dx'd as asthma and def needs w/u.  First rx as the asthma component and come back for pfts/ alpha one screening and sreening for ABPA with IgE

## 2014-06-18 NOTE — Progress Notes (Signed)
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 Pulmonary Consult    Self referral. Pt states that he has had PNA x 3 in the past year.  He c/o cough and SOB since Sept 2015.  He states that he gets SOB with minimal exertion and "takes short breaths at rest". His cough is non prod at this time. He c/o sore throat just since this am. He is using proair about once per day and neb with albuterol 2 x per day.   onset was insidious p started new job 2 y prior to OV  Cough non prod first then worsening sob Albuterol helps some/ prednisone helps a lot and saba  not needed as much when on vacation  Does better at home or vacation but still cough and sob with exertion.  No obvious other patterns in day to day or daytime variabilty or assoc    cp or chest tightness, subjective wheeze overt sinus or hb symptoms. No unusual exp hx or h/o childhood pna/ asthma or knowledge of premature birth.  Sleeping ok without nocturnal  or early am exacerbation  of respiratory  c/o's or need for noct saba. Also denies any obvious fluctuation of symptoms with weather or environmental changes or other aggravating or alleviating factors except as outlined above   Current Medications, Allergies, Complete Past Medical History, Past Surgical History, Family History, and Social History were reviewed in [facility] electronic medical record.             Review of Systems  Constitutional: Negative for fever, chills, activity change, appetite change and unexpected weight change.  HENT: Positive for  congestion and sore throat. Negative for dental problem, postnasal drip, rhinorrhea, sneezing, trouble swallowing and voice change.   Eyes: Negative for visual disturbance.  Respiratory: Positive for cough and shortness of breath. Negative for choking.   Cardiovascular: Negative for chest pain and leg swelling.  Gastrointestinal: Negative for nausea, vomiting and abdominal pain.  Genitourinary: Negative for difficulty urinating.  Musculoskeletal: Negative for arthralgias.  Skin: Negative for rash.  Psychiatric/Behavioral: Negative for behavioral problems and confusion.       Objective:   Physical Exam  Hoarse amb wm nad  Wt Readings from Last 3 Encounters:  06/18/14 203 lb (92.08 kg)  06/07/14 201 lb 1 oz (91.2 kg)  07/05/11 210 lb (95.255 kg)    Vital signs reviewed   HEENT: edentulous/ nl  turbinates, and orophanx. Nl external ear canals without cough reflex   NECK :  without JVD/Nodes/TM/ nl carotid upstrokes bilaterally   LUNGS: no acc muscle use, clear to A and P bilaterally without cough on insp or exp maneuvers   CV:  RRR  no s3 or murmur or increase in P2, no edema   ABD:  soft and nontender with nl excursion in the supine position. No bruits or organomegaly, bowel sounds nl  MS:  warm without deformities, calf tenderness, cyanosis or clubbing  SKIN: warm and dry without lesions    NEURO:  alert, approp, no deficits  CTa 06/15/14 No evidence of significant pulmonary embolus. Infiltration or consolidation in the lung bases associated with Mild bronchiectasis and mucous plugging.           Assessment & Plan:

## 2014-06-19 LAB — ALLERGY FULL PROFILE
ALLERGEN, D PTERNOYSSINUS, D1: 0.16 kU/L — AB
Allergen,Goose feathers, e70: <0.1 kU/L
Alternaria Alternata: 0.33 kU/L — ABNORMAL HIGH
Aspergillus fumigatus, m3: 1.94 kU/L — ABNORMAL HIGH
Bermuda Grass: <0.1 kU/L
CURVULARIA LUNATA: 0.23 kU/L — AB
Candida Albicans: 1.01 kU/L — ABNORMAL HIGH
Cat Dander: 3.03 kU/L — ABNORMAL HIGH
D. farinae: 0.16 kU/L — ABNORMAL HIGH
DOG DANDER: 3.93 kU/L — AB
Fescue: <0.1 kU/L
Goldenrod: <0.1 kU/L
Helminthosporium halodes: 0.7 kU/L — ABNORMAL HIGH
House Dust Hollister: 0.23 kU/L — ABNORMAL HIGH
IgE (Immunoglobulin E), Serum: 728 kU/L — ABNORMAL HIGH (ref <115)
Lamb's Quarters: <0.1 kU/L
Oak: <0.1 kU/L
Plantain: <0.1 kU/L
Stemphylium Botryosum: 0.56 kU/L — ABNORMAL HIGH
Timothy Grass: <0.1 kU/L

## 2014-06-25 NOTE — Progress Notes (Signed)
Quick Note:  LMTCB ______ 

## 2014-06-26 ENCOUNTER — Telehealth: Payer: Self-pay | Admitting: Internal Medicine

## 2014-06-26 NOTE — Telephone Encounter (Signed)
Notes Recorded by Christen ButterLeslie M Raskin, CMA on 06/25/2014 at 4:58 PM LMTCB Notes Recorded by Nyoka CowdenMichael B Wert, MD on 06/25/2014 at 3:47 PM Call patient : Studies are c/w allergies to cat = dog, Somewhat to dust and mold    Called and spoke to pt. Informed pt of the results per MW. Pt verbalized understanding and denied any further questions or concerns at this time.

## 2014-06-26 NOTE — Progress Notes (Signed)
Quick Note:  Called and spoke to pt. Informed pt of the results per MW. Pt verbalized understanding and denied any further questions or concerns at this time. ______ 

## 2014-07-22 ENCOUNTER — Encounter: Payer: Self-pay | Admitting: Internal Medicine

## 2014-07-22 ENCOUNTER — Ambulatory Visit (INDEPENDENT_AMBULATORY_CARE_PROVIDER_SITE_OTHER): Payer: BLUE CROSS/BLUE SHIELD | Admitting: Internal Medicine

## 2014-07-22 ENCOUNTER — Other Ambulatory Visit: Payer: Self-pay | Admitting: Internal Medicine

## 2014-07-22 VITALS — BP 164/108 | HR 98 | Ht 69.0 in | Wt 210.0 lb

## 2014-07-22 DIAGNOSIS — R0600 Dyspnea, unspecified: Secondary | ICD-10-CM

## 2014-07-22 DIAGNOSIS — J4530 Mild persistent asthma, uncomplicated: Secondary | ICD-10-CM

## 2014-07-22 DIAGNOSIS — J479 Bronchiectasis, uncomplicated: Secondary | ICD-10-CM

## 2014-07-22 LAB — PULMONARY FUNCTION TEST
DL/VA % pred: 124 %
DL/VA: 5.67 ml/min/mmHg/L
DLCO unc % pred: 108 %
DLCO unc: 33.68 ml/min/mmHg
FEF 25-75 Post: 7.59 L/sec
FEF 25-75 Pre: 4.82 L/sec
FEF2575-%CHANGE-POST: 57 %
FEF2575-%PRED-POST: 244 %
FEF2575-%PRED-PRE: 155 %
FEV1-%CHANGE-POST: 28 %
FEV1-%Pred-Post: 103 %
FEV1-%Pred-Pre: 80 %
FEV1-POST: 3.77 L
FEV1-Pre: 2.93 L
FEV1FVC-%CHANGE-POST: 4 %
FEV1FVC-%PRED-PRE: 114 %
FEV6-%CHANGE-POST: 23 %
FEV6-%PRED-PRE: 73 %
FEV6-%Pred-Post: 89 %
FEV6-Post: 4.08 L
FEV6-Pre: 3.32 L
FEV6FVC-%PRED-POST: 104 %
FEV6FVC-%Pred-Pre: 104 %
FVC-%CHANGE-POST: 23 %
FVC-%PRED-POST: 86 %
FVC-%Pred-Pre: 69 %
FVC-POST: 4.08 L
FVC-PRE: 3.32 L
POST FEV1/FVC RATIO: 92 %
PRE FEV6/FVC RATIO: 100 %
Post FEV6/FVC ratio: 100 %
Pre FEV1/FVC ratio: 88 %
RV % pred: 77 %
RV: 1.62 L
TLC % pred: 88 %
TLC: 6 L

## 2014-07-22 MED ORDER — BUDESONIDE-FORMOTEROL FUMARATE 160-4.5 MCG/ACT IN AERO: 2.0000 | INHALATION_SPRAY | Freq: Two times a day (BID) | RESPIRATORY_TRACT | Status: DC

## 2014-07-22 NOTE — Patient Instructions (Addendum)
Symbicort 160 Take 2 puffs first thing in am and then another 2 puffs about 12 hours later   Only use your albuterol as a rescue medication to be used if you can't catch your breath by resting or doing a relaxed purse lip breathing pattern.  - The less you use it, the better it will work when you need it. - Ok to use up to 2 puffs  every 4 hours if you must but call for immediate appointment if use goes up over your usual need - Don't leave home without it !!  (think of it like the spare tire for your car)  If not satisfied > singulair 10 mg per day and then do sinus ct to decide re  allergy evaluation vs ent eval   Please schedule a follow up visit in 3 months but call sooner if needed  Late add : full pneumococcal pna rx

## 2014-07-22 NOTE — Progress Notes (Signed)
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 Pulmonary Consult    Self referral. Pt states that he has had PNA x 3 in the past year.  He c/o cough and SOB since Sept 2015.  He states that he gets SOB with minimal exertion and "takes short breaths at rest". His cough is non prod at this time. He c/o sore throat just since this am. He is using proair about once per day and neb with albuterol 2 x per day.   onset was insidious p started new job 2 y prior to OV  Cough non prod first then worsening sob Albuterol helps some/ prednisone helps a lot and saba  not needed as much when on vacation  Does better at home or vacation but still cough and sob with exertion. rec Symbicort 80 Take 2 puffs first thing in am and then another 2 puffs about 12 hours later.  only use your albuterol (proair)  As rescue  GERD  Diet  Try prilosec 20mg  Take 30-60 min before first meal of the day and Pepcid 20 mg one at  bedtime until return Please remember to go to the lab department downstairs for your tests - we will call you with the results when they are available.    07/22/2014 f/u ov/Tyriq Moragne re: symbicort 80 2bid not as good about using the prilosec/pepcid and no noct cough now Chief Complaint  Patient presents with  . Follow-up    PFT done today. Cough has resolved. His breathing is unchanged. He has not had to use albuterol neb or inhaler in the past wk.   thinks breathing/ coughing better as not working much over the holidays - has  respirator for when returns to work   No obvious day to day or daytime variabilty or assoc  cp or chest tightness, subjective wheeze overt sinus or hb symptoms. No unusual exp hx or h/o childhood pna/ asthma or knowledge of premature birth.  Sleeping ok without nocturnal  or early am exacerbation  of respiratory  c/o's or need for noct saba. Also denies any obvious fluctuation of symptoms with weather or environmental changes or other aggravating or alleviating factors except as outlined above   Current Medications, Allergies, Complete Past Medical History, Past Surgical History, Family History, and Social History were reviewed in [facility] electronic medical record.  ROS  The following are not active complaints unless bolded sore throat, dysphagia, dental problems, itching, sneezing,  nasal congestion or excess/ purulent secretions, ear ache,   fever, chills, sweats, unintended wt loss, pleuritic or exertional cp, hemoptysis,  orthopnea pnd or leg swelling, presyncope, palpitations, heartburn, abdominal pain, anorexia, nausea, vomiting, diarrhea  or change in bowel or urinary habits, change in stools or urine, dysuria,hematuria,  rash, arthralgias, visual complaints, headache, numbness weakness or ataxia or problems with walking or coordination,  change in mood/affect or memory.        Objective:   Physical Exam   amb wm nad  07/22/2014  210  Wt Readings from Last 3 Encounters:  06/18/14 203 lb (92.08 kg)  06/07/14 201 lb 1 oz (91.2 kg)  07/05/11 210 lb (95.255 kg)    Vital signs reviewed   HEENT: edentulous/ nl  turbinates, and orophanx. Nl external ear canals without cough reflex   NECK :  without JVD/Nodes/TM/ nl carotid upstrokes bilaterally   LUNGS: no acc muscle use, clear to A and P bilaterally without cough on insp or exp maneuvers   CV:  RRR  no s3 or murmur or increase in P2, no edema   ABD:  soft and nontender with nl excursion in the supine position. No  bruits or organomegaly, bowel sounds nl  MS:  warm without deformities, calf tenderness, cyanosis or clubbing  SKIN: warm and dry without lesions          CTa 06/15/14 No evidence of significant pulmonary embolus. Infiltration or consolidation in the lung bases associated with Mild bronchiectasis and mucous plugging.           Assessment & Plan:

## 2014-07-22 NOTE — Assessment & Plan Note (Signed)
-  06/18/2014   trial of symbicort 80 2bid  - Allergy profile 06/18/2014 >  Eos 3.6 % IgE 728 cat = dog > dust/ mold  - PFTs 07/22/2014  FEV1  3.77 ( 103%) ratio 92 p 28% improvement despite symbiocort 80 x 2 in am so increased to symbicort 160 2bid - 07/22/2014 p extensive coaching HFA effectiveness =    90%   All goals of chronic asthma control met including optimal function and elimination of symptoms with minimal need for rescue therapy.  Contingencies discussed in full including contacting this office immediately if not controlling the symptoms using the rule of two's.     He has not "rechallenged" with paint exp yet and so suggested:  Increase symbicort to 160 2bid for now and add singulair next if symptoms not controlled

## 2014-07-22 NOTE — Assessment & Plan Note (Signed)
No active symptoms / no need for further w/u at this point given excellent baseline lung function but this is the likely cause of his recurrent pna pattern - needs full pneumococcal pna rx (will address at next ov)

## 2014-07-22 NOTE — Progress Notes (Signed)
PFT done today. 

## 2014-07-28 ENCOUNTER — Encounter (HOSPITAL_COMMUNITY): Payer: Self-pay | Admitting: Emergency Medicine

## 2014-07-28 ENCOUNTER — Emergency Department (HOSPITAL_COMMUNITY): Payer: BLUE CROSS/BLUE SHIELD

## 2014-07-28 ENCOUNTER — Emergency Department (HOSPITAL_COMMUNITY)
Admission: EM | Admit: 2014-07-28 | Discharge: 2014-07-28 | Disposition: A | Discharge status: Home / Self Care | Payer: BLUE CROSS/BLUE SHIELD | Attending: Emergency Medicine | Admitting: Emergency Medicine

## 2014-07-28 DIAGNOSIS — F329 Major depressive disorder, single episode, unspecified: Secondary | ICD-10-CM | POA: Insufficient documentation

## 2014-07-28 DIAGNOSIS — M25551 Pain in right hip: Secondary | ICD-10-CM | POA: Insufficient documentation

## 2014-07-28 DIAGNOSIS — M79604 Pain in right leg: Secondary | ICD-10-CM | POA: Diagnosis present

## 2014-07-28 DIAGNOSIS — Z8701 Personal history of pneumonia (recurrent): Secondary | ICD-10-CM | POA: Insufficient documentation

## 2014-07-28 DIAGNOSIS — I10 Essential (primary) hypertension: Secondary | ICD-10-CM | POA: Diagnosis not present

## 2014-07-28 DIAGNOSIS — Z8673 Personal history of transient ischemic attack (TIA), and cerebral infarction without residual deficits: Secondary | ICD-10-CM | POA: Diagnosis not present

## 2014-07-28 DIAGNOSIS — Z79899 Other long term (current) drug therapy: Secondary | ICD-10-CM | POA: Diagnosis not present

## 2014-07-28 DIAGNOSIS — R52 Pain, unspecified: Secondary | ICD-10-CM

## 2014-07-28 DIAGNOSIS — Z87442 Personal history of urinary calculi: Secondary | ICD-10-CM | POA: Diagnosis not present

## 2014-07-28 DIAGNOSIS — Z7951 Long term (current) use of inhaled steroids: Secondary | ICD-10-CM | POA: Diagnosis not present

## 2014-07-28 MED ORDER — ONDANSETRON HCL 4 MG/2ML IJ SOLN
4.0000 mg | Freq: Once | INTRAMUSCULAR | Status: AC
  Administered 2014-07-28: 4 mg via INTRAVENOUS
  Filled 2014-07-28: qty 2

## 2014-07-28 MED ORDER — HYDROMORPHONE HCL 1 MG/ML IJ SOLN
1.0000 mg | Freq: Once | INTRAMUSCULAR | Status: AC
  Administered 2014-07-28: 1 mg via INTRAVENOUS
  Filled 2014-07-28: qty 1

## 2014-07-28 MED ORDER — OXYCODONE-ACETAMINOPHEN 5-325 MG PO TABS: 1.0000 | ORAL_TABLET | Freq: Four times a day (QID) | ORAL | Status: DC | PRN

## 2014-07-28 NOTE — ED Notes (Addendum)
Pt c/o right leg pain from hip pain radiating down leg. Previous  Bilateral hip replacement 2010. [facility] and was told to come here. Pt denies Injury but reports pain began after working on Friday in which he climbs ladders from 14-16 ft. Strong bilateral pedal pulses present

## 2014-07-28 NOTE — ED Notes (Signed)
Pt c/o leg pain from thigh down, pt had hip replacement surgery and knee surgery in that leg. Pt sent over here by urgent care to rule out blood clots, denies injury.

## 2014-07-28 NOTE — ED Notes (Signed)
Ice Pack provided.

## 2014-07-28 NOTE — Discharge Instructions (Signed)
Follow up with your ortho md this week.   Rest at home for 2-3 days

## 2014-07-28 NOTE — ED Notes (Signed)
Pt alert, oriented, and ambulatory upon DC. He was advised to follow Ortho this week and rest for 3-4 days. Work note provided.

## 2014-07-29 ENCOUNTER — Other Ambulatory Visit (HOSPITAL_COMMUNITY): Payer: Self-pay | Admitting: Cardiology

## 2014-07-29 ENCOUNTER — Ambulatory Visit (HOSPITAL_COMMUNITY): Payer: BLUE CROSS/BLUE SHIELD | Attending: Orthopedic Surgery | Admitting: Cardiology

## 2014-07-29 DIAGNOSIS — M25561 Pain in right knee: Secondary | ICD-10-CM

## 2014-07-29 DIAGNOSIS — M7989 Other specified soft tissue disorders: Secondary | ICD-10-CM

## 2014-07-29 DIAGNOSIS — I10 Essential (primary) hypertension: Secondary | ICD-10-CM | POA: Insufficient documentation

## 2014-07-29 DIAGNOSIS — M79604 Pain in right leg: Secondary | ICD-10-CM

## 2014-07-29 NOTE — Progress Notes (Signed)
Right lower extremity venous duplex performed  

## 2014-07-30 NOTE — ED Provider Notes (Signed)
CSN: [id number]     Arrival date & time 07/28/14  1204 History   First MD Initiated Contact with Patient 07/28/14 1413     Chief Complaint  Patient presents with  . Leg Pain    right     (Consider location/radiation/quality/duration/timing/severity/associated sxs/prior Treatment) Patient is a 54 y.o. male presenting with leg pain. The history is provided by the patient (the pt complains of pain in his right hip and femur).  Leg Pain Location:  Hip Injury: no   Hip location:  R hip Pain details:    Quality:  Aching   Radiates to: radiates down femur.   Severity:  Moderate   Onset quality:  Sudden   Timing:  Constant Chronicity:  New Dislocation: no   Associated symptoms: no back pain and no fatigue     Past Medical History  Diagnosis Date  . Kidney stones   . Stroke   . Hypertension   . Depression   . Pneumonia    Past Surgical History  Procedure Laterality Date  . Joint replacement    . Knee surgery    . Hernia repair    . Cystoscopy    . Hip replacements     Family History  Problem Relation Age of Onset  . Heart disease Father    History  Substance Use Topics  . Smoking status: Never Smoker   . Smokeless tobacco: Never Used  . Alcohol Use: No    Review of Systems  Constitutional: Negative for appetite change and fatigue.  HENT: Negative for congestion, ear discharge and sinus pressure.   Eyes: Negative for discharge.  Respiratory: Negative for cough.   Cardiovascular: Negative for chest pain.  Gastrointestinal: Negative for abdominal pain and diarrhea.  Genitourinary: Negative for frequency and hematuria.  Musculoskeletal: Negative for back pain.       Pain right hip  Skin: Negative for rash.  Neurological: Negative for seizures and headaches.  Psychiatric/Behavioral: Negative for hallucinations.      Allergies  Review of patient's allergies indicates no known allergies.  Home Medications   Prior to Admission medications   Medication Sig  Start Date End Date Taking? Authorizing Provider  albuterol (PROVENTIL HFA;VENTOLIN HFA) 108 (90 BASE) MCG/ACT inhaler Inhale 1 puff into the lungs every 6 (six) hours as needed for wheezing or shortness of breath.   Yes Historical Provider, MD  albuterol (PROVENTIL) (2.5 MG/3ML) 0.083% nebulizer solution Take 2.5 mg by nebulization every 6 (six) hours as needed for wheezing or shortness of breath.   Yes Historical Provider, MD  Ascorbic Acid (VITAMIN C) 1000 MG tablet Take 1,000 mg by mouth daily.   Yes Historical Provider, MD  budesonide-formoterol (SYMBICORT) 160-4.5 MCG/ACT inhaler Inhale 2 puffs into the lungs 2 (two) times daily. 07/22/14  Yes Nyoka Cowden, MD  buPROPion (WELLBUTRIN XL) 150 MG 24 hr tablet Take 150 mg by mouth 2 (two) times daily.   Yes Historical Provider, MD  cholecalciferol (VITAMIN D) 1000 UNITS tablet Take 1,000 Units by mouth daily.   Yes Historical Provider, MD  fexofenadine-pseudoephedrine (ALLEGRA-D 24) 180-240 MG per 24 hr tablet Take 1 tablet by mouth daily.   Yes Historical Provider, MD  fluticasone (FLONASE) 50 MCG/ACT nasal spray Place 1 spray into both nostrils 2 (two) times daily as needed for allergies or rhinitis.   Yes Historical Provider, MD  losartan (COZAAR) 25 MG tablet Take 25 mg by mouth daily.   Yes Historical Provider, MD  Multiple Vitamin (MULTIVITAMIN WITH MINERALS)  TABS tablet Take 1 tablet by mouth daily.   Yes Historical Provider, MD  Pitavastatin Calcium (LIVALO) 4 MG TABS Take 4 mg by mouth at bedtime.    Yes Historical Provider, MD  oxyCODONE-acetaminophen (PERCOCET) 5-325 MG per tablet Take 1 tablet by mouth every 6 (six) hours as needed. 07/28/14   Benny LennertJoseph L Treshaun Carrico, MD   BP 128/73 mmHg  Pulse 75  Temp(Src) 98.6 F (37 C) (Oral)  Resp 16  SpO2 95% Physical Exam  Constitutional: He is oriented to person, place, and time. He appears well-developed.  HENT:  Head: Normocephalic.  Eyes: Conjunctivae and EOM are normal. No scleral icterus.   Neck: Neck supple. No thyromegaly present.  Cardiovascular: Normal rate and regular rhythm.  Exam reveals no gallop and no friction rub.   No murmur heard. Pulmonary/Chest: No stridor. He has no wheezes. He has no rales. He exhibits no tenderness.  Abdominal: He exhibits no distension. There is no tenderness. There is no rebound.  Musculoskeletal: Normal range of motion. He exhibits no edema.  Moderate tenderness right buttocks,   Neuro vasc nl.  Non tender to post femur  Lymphadenopathy:    He has no cervical adenopathy.  Neurological: He is oriented to person, place, and time. He exhibits normal muscle tone. Coordination normal.  Skin: No rash noted. No erythema.  Psychiatric: He has a normal mood and affect. His behavior is normal.    ED Course  Procedures (including critical care time) Labs Review Labs Reviewed - No data to display  Imaging Review Dg Pelvis 1-2 Views  07/28/2014   CLINICAL DATA:  Pain in right hip x 3 days with no known injury; hx bilateral hip surgery done on 2010; pain in right hip, radiates down to right knee  EXAM: PELVIS - 1-2 VIEW  COMPARISON:  CT, 07/05/2011  FINDINGS: No fracture. Bilateral hip prostheses are well-seated and aligned with no evidence of loosening.  SI joints and symphysis pubis are normally spaced and aligned.  Calculi in the right lower quadrant reflect appendix was noted on the prior CT.  IMPRESSION: No fracture. No evidence of malalignment or loosening of either hip prosthesis.   Electronically Signed   By: Amie Portlandavid  Ormond M.D.   On: 07/28/2014 15:28   Dg Femur, Min 2 Views Right  07/28/2014   CLINICAL DATA:  Pain in right hip x 3 days with no known injury; hx bilateral hip surgery done on 2010; pain in right hip, radiates down to right knee;  EXAM: DG FEMUR 2+V*R*  COMPARISON:  None.  FINDINGS: No fracture or bone lesion.  Right hip prosthesis is well-seated and aligned. Knee joint normally spaced and aligned.  Soft tissues are unremarkable.   IMPRESSION: No fracture or acute finding. No evidence of loosening or malalignment of the right hip prosthesis.   Electronically Signed   By: Amie Portlandavid  Ormond M.D.   On: 07/28/2014 15:28     EKG Interpretation None      MDM   Final diagnoses:  Pain  Acute pain of right hip    Pain in right hip.   S/p surgery.  Pt to follow up with ortho this week    Benny LennertJoseph L Garv Kuechle, MD 07/30/14 [phone]

## 2014-12-03 ENCOUNTER — Other Ambulatory Visit: Payer: Self-pay | Admitting: Internal Medicine

## 2015-01-06 ENCOUNTER — Other Ambulatory Visit: Payer: Self-pay | Admitting: Internal Medicine

## 2016-01-29 IMAGING — CT CT ANGIO CHEST
2 of 6 series · 19 of 36 positions shown · IV contrast (APPLIED)
Comparison: 07/22/2010

CLINICAL DATA: Fever, cough, sore throat, hemoptysis. History of
hypertension.

EXAM:
CT ANGIOGRAPHY CHEST WITH CONTRAST
TECHNIQUE: Multidetector CT imaging of the chest was performed using the
standard protocol during bolus administration of intravenous
contrast. Multiplanar CT image reconstructions and MIPs were
obtained to evaluate the vascular anatomy.
CONTRAST:  100mL OMNIPAQUE IOHEXOL 350 MG/ML SOLN

[Series 5: pe 1.0 b26f · axial · 0.70mm/px · z∈[-234,-12]mm · 18 of 248 slices shown]
[im 13/248  lung]
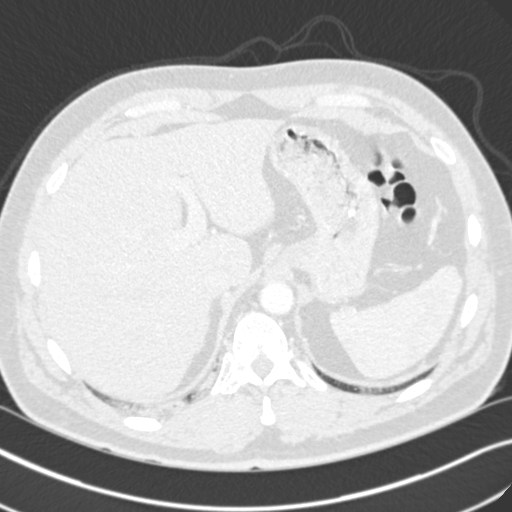
[im 25/248  mediastinal]
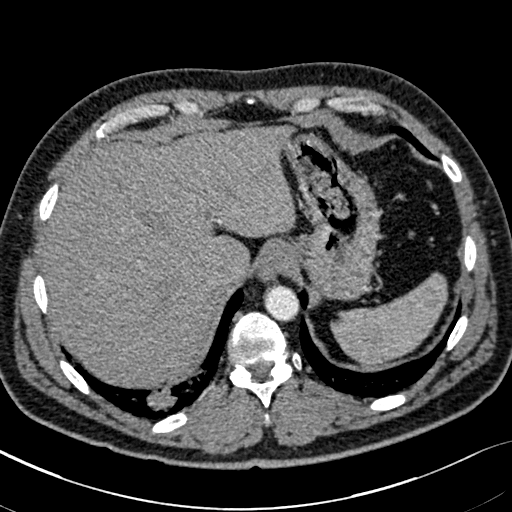
[im 38/248  lung]
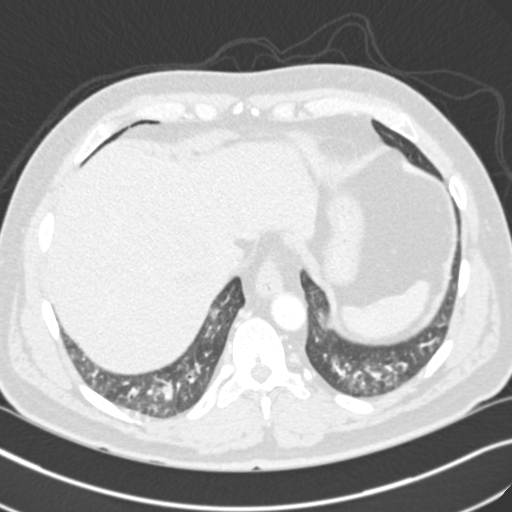
[im 50/248  mediastinal]
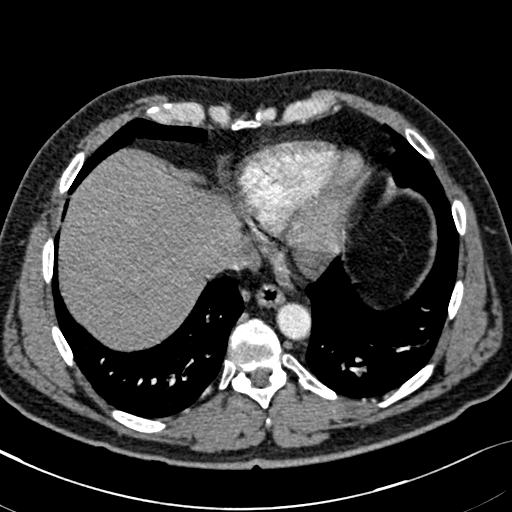
[im 62/248  lung]
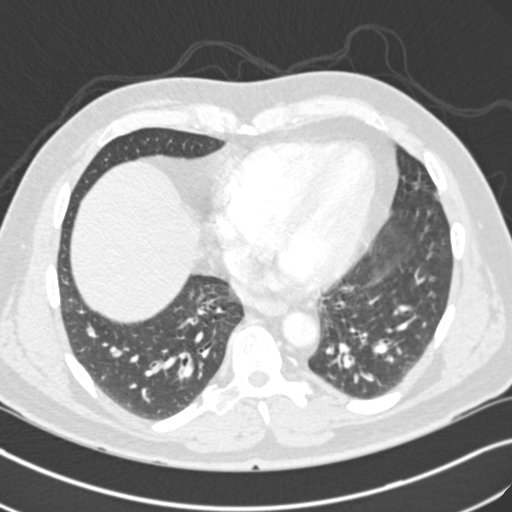
[im 75/248  mediastinal]
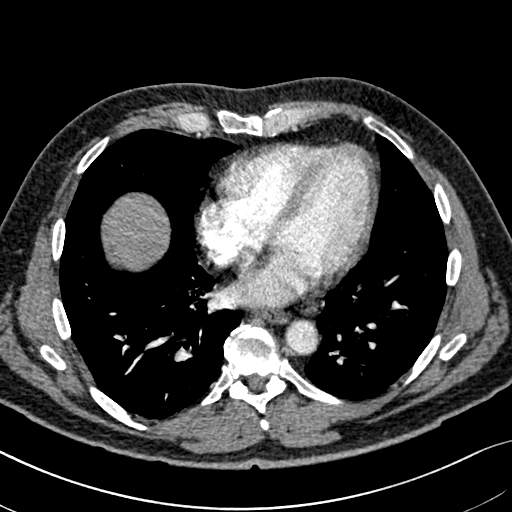
[im 87/248  lung]
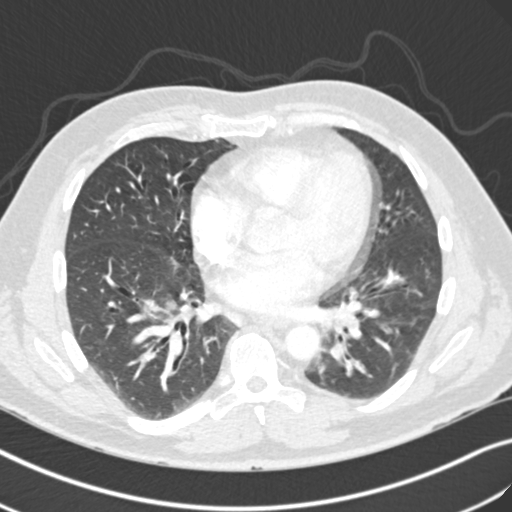
[im 99/248  mediastinal]
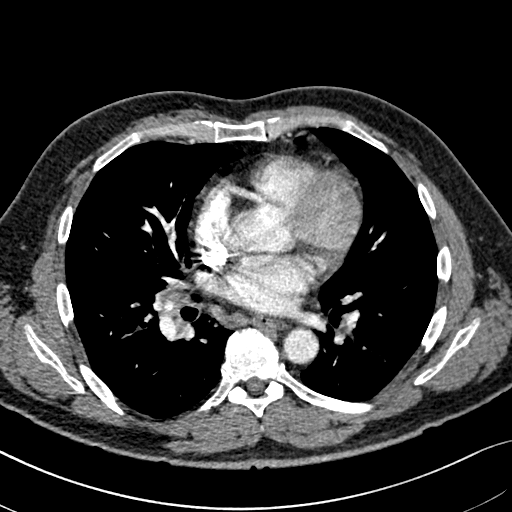
[im 112/248  lung]
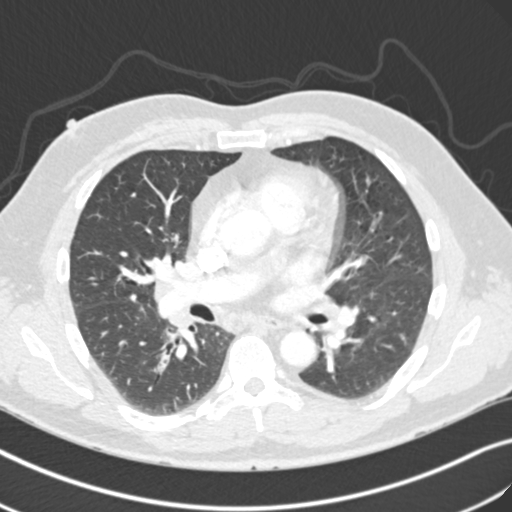
[im 136/248  mediastinal]
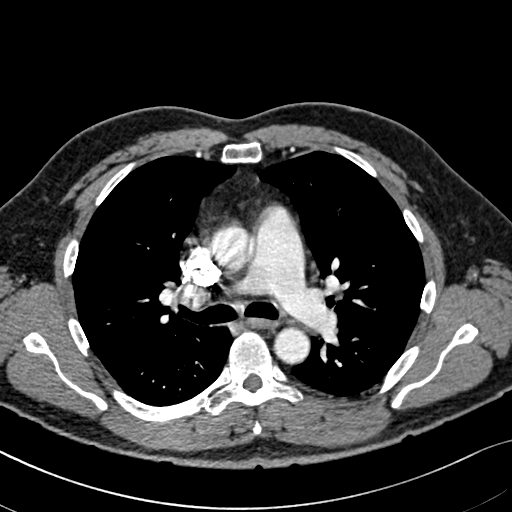
[im 149/248  lung]
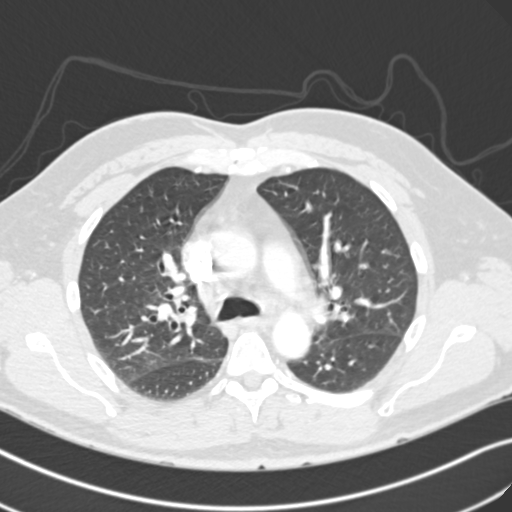
[im 161/248  mediastinal]
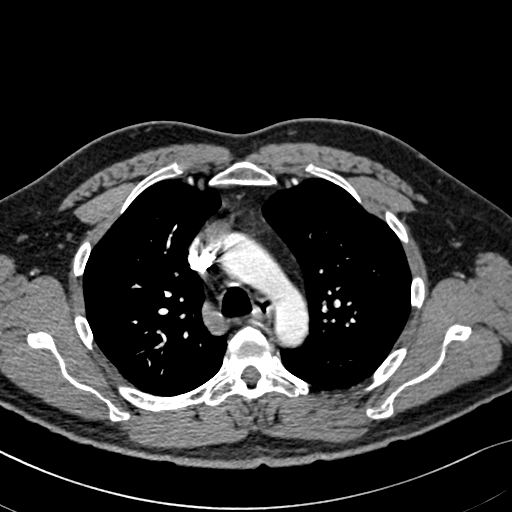
[im 173/248  lung]
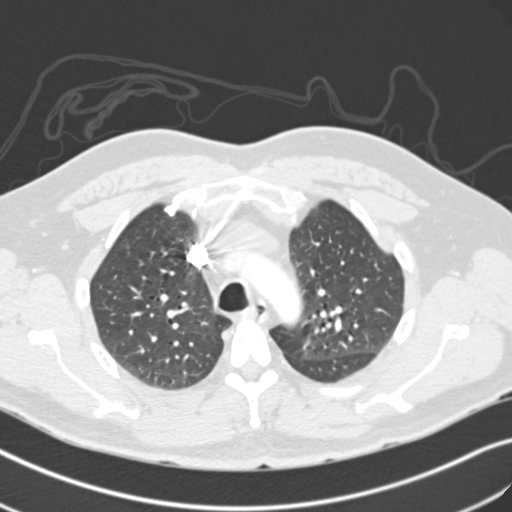
[im 186/248  mediastinal]
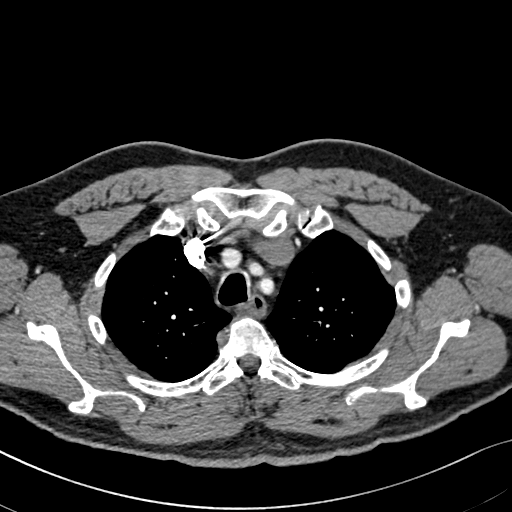
[im 198/248  lung]
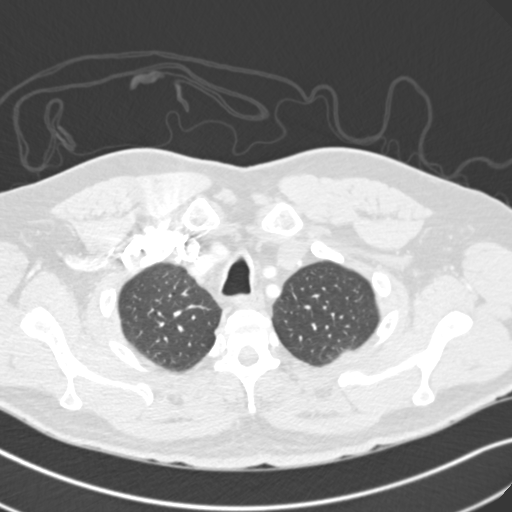
[im 210/248  mediastinal]
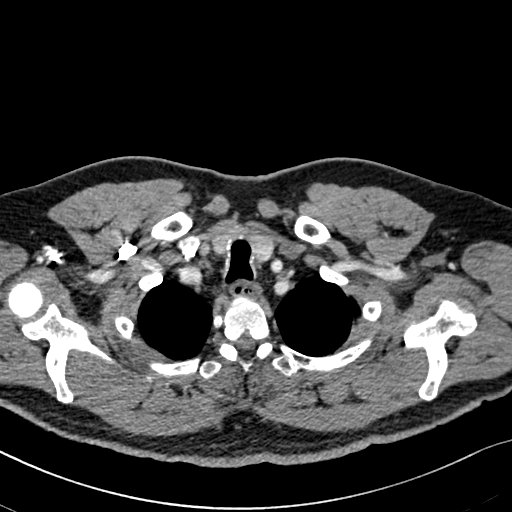
[im 223/248  lung]
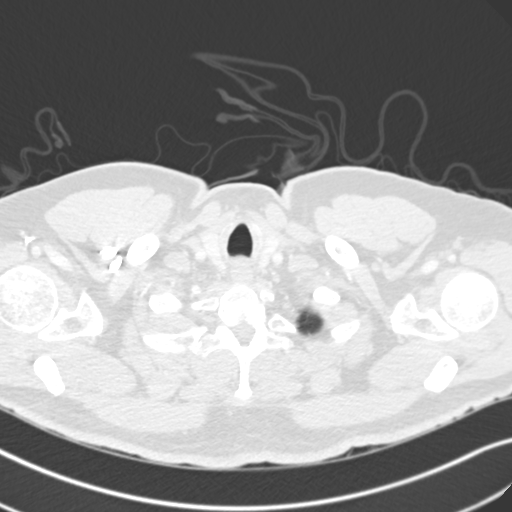
[im 235/248  mediastinal]
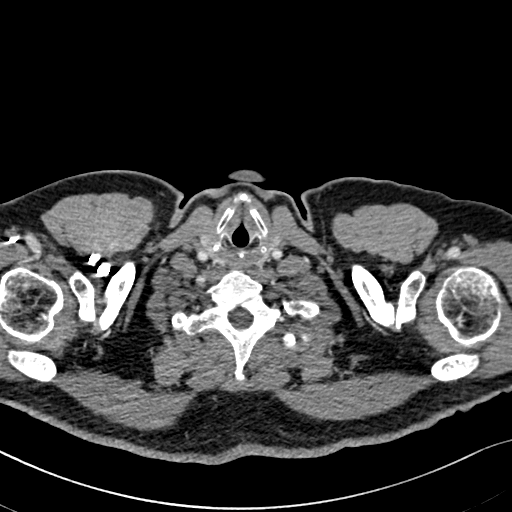

[Series 8: pe 2.0 coronal · coronal · 0.51mm/px · 1 of 126 slices shown]
[im 63/126  mediastinal]
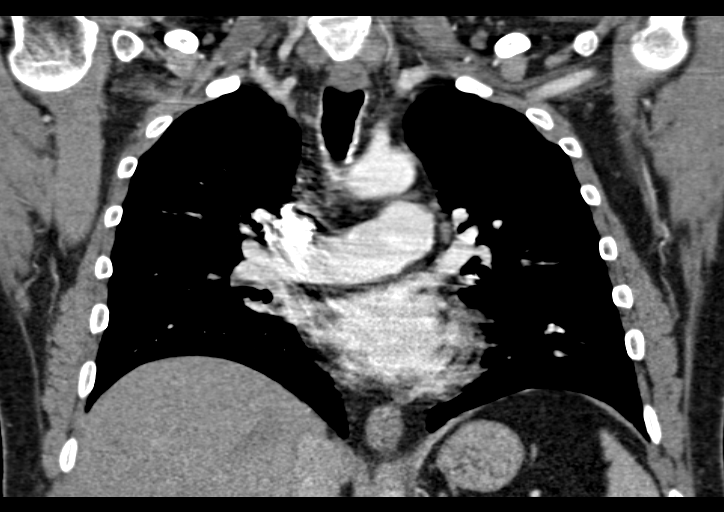

[19 of 36 positions shown; findings below may reference images not displayed]

FINDINGS: Technically adequate study with moderately good opacification of the
central and segmental pulmonary arteries. No focal filling defects
are demonstrated. No evidence of significant pulmonary embolus.

Normal heart size. Normal caliber thoracic aorta. No evidence of
aortic dissection allowing for motion artifact. Great vessel origins
are patent. Esophagus is decompressed. No significant
lymphadenopathy in the chest.

Atelectasis or infiltration is demonstrated in both costophrenic
angles posteriorly. With focal consolidation and with some mucous
plugging demonstrated in the right lung base. Mild bronchiectasis.
No pleural effusions. No pneumothorax.

Included portions of the upper abdominal organs are grossly
unremarkable. Mild degenerative changes in the thoracic spine.

Review of the MIP images confirms the above findings.
IMPRESSION: No evidence of significant pulmonary embolus.

Infiltration or consolidation in the lung bases associated with Mild
bronchiectasis and mucous plugging.

## 2017-06-07 ENCOUNTER — Other Ambulatory Visit: Payer: Self-pay

## 2017-06-07 ENCOUNTER — Emergency Department (HOSPITAL_BASED_OUTPATIENT_CLINIC_OR_DEPARTMENT_OTHER)
Admission: EM | Admit: 2017-06-07 | Discharge: 2017-06-07 | Disposition: A | Discharge status: Home / Self Care | Payer: BLUE CROSS/BLUE SHIELD | Attending: Emergency Medicine | Admitting: Emergency Medicine

## 2017-06-07 ENCOUNTER — Encounter (HOSPITAL_BASED_OUTPATIENT_CLINIC_OR_DEPARTMENT_OTHER): Payer: Self-pay | Admitting: *Deleted

## 2017-06-07 ENCOUNTER — Emergency Department (HOSPITAL_BASED_OUTPATIENT_CLINIC_OR_DEPARTMENT_OTHER): Payer: BLUE CROSS/BLUE SHIELD

## 2017-06-07 DIAGNOSIS — J45909 Unspecified asthma, uncomplicated: Secondary | ICD-10-CM | POA: Diagnosis not present

## 2017-06-07 DIAGNOSIS — I10 Essential (primary) hypertension: Secondary | ICD-10-CM | POA: Insufficient documentation

## 2017-06-07 DIAGNOSIS — R05 Cough: Secondary | ICD-10-CM

## 2017-06-07 DIAGNOSIS — Z96649 Presence of unspecified artificial hip joint: Secondary | ICD-10-CM | POA: Diagnosis not present

## 2017-06-07 DIAGNOSIS — J069 Acute upper respiratory infection, unspecified: Secondary | ICD-10-CM | POA: Insufficient documentation

## 2017-06-07 DIAGNOSIS — J410 Simple chronic bronchitis: Secondary | ICD-10-CM | POA: Insufficient documentation

## 2017-06-07 DIAGNOSIS — Z79899 Other long term (current) drug therapy: Secondary | ICD-10-CM | POA: Insufficient documentation

## 2017-06-07 DIAGNOSIS — R059 Cough, unspecified: Secondary | ICD-10-CM

## 2017-06-07 MED ORDER — HYDROCOD POLST-CPM POLST ER 10-8 MG/5ML PO SUER: 5.0000 mL | Freq: Two times a day (BID) | ORAL | 0 refills | Status: DC | PRN

## 2017-06-07 MED ORDER — DOXYCYCLINE HYCLATE 100 MG PO CAPS: 100.0000 mg | ORAL_CAPSULE | Freq: Two times a day (BID) | ORAL | 0 refills | Status: DC

## 2017-06-07 NOTE — ED Triage Notes (Signed)
Pt c/o flu-like symptoms x3 days

## 2017-06-07 NOTE — ED Notes (Signed)
ED Provider at bedside. 

## 2017-06-07 NOTE — ED Provider Notes (Signed)
[facility] EMERGENCY DEPARTMENT Provider Note   CSN: [id number] Arrival date & time: 06/07/17  1346     History   Chief Complaint Chief Complaint  Patient presents with  . URI    HPI Dustin Randall is a 56 y.o. male.  Patient with history of asthma/bronchitis. Cough x 4 days. Low grade fever. Anorexia. Seen at an urgent care on 06/03/17. Negative POCT for influenza. Review of record indicates CXR obtained on that date suspicious for LLL pneumonia.      Cough  This is a new problem. The current episode started more than 2 days ago. The problem occurs constantly. The problem has been gradually worsening. The cough is productive of sputum. The maximum temperature recorded prior to his arrival was 100 to 100.9 F. The fever has been present for 3 to 4 days. Associated symptoms include chills, rhinorrhea, sore throat, myalgias and shortness of breath. He has tried cough syrup for the symptoms. The treatment provided no relief. His past medical history is significant for bronchitis.    Past Medical History:  Diagnosis Date  . Depression   . Hypertension   . Kidney stones   . Pneumonia   . Stroke [facility]     Patient Active Problem List   Diagnosis Date Noted  . Mild persistent asthma without complication 06/18/2014  . Hypokalemia 06/07/2014  . Essential hypertension 06/07/2014  . Bronchiectasis (HCC) 06/05/2014  . CAP (community acquired pneumonia) 06/05/2014  . Chronic bronchitis (HCC) 06/05/2014  . Sepsis (HCC) 06/05/2014    Past Surgical History:  Procedure Laterality Date  . CYSTOSCOPY    . HERNIA REPAIR    . hip replacements    . JOINT REPLACEMENT    . KNEE SURGERY         Home Medications    Prior to Admission medications   Medication Sig Start Date End Date Taking? Authorizing Provider  albuterol (PROVENTIL HFA;VENTOLIN HFA) 108 (90 BASE) MCG/ACT inhaler Inhale 1 puff into the lungs every 6 (six) hours as needed for wheezing or shortness of  breath.    [provider]  albuterol (PROVENTIL) (2.5 MG/3ML) 0.083% nebulizer solution Take 2.5 mg by nebulization every 6 (six) hours as needed for wheezing or shortness of breath.    [provider]  Ascorbic Acid (VITAMIN C) 1000 MG tablet Take 1,000 mg by mouth daily.    [provider]  buPROPion (WELLBUTRIN XL) 150 MG 24 hr tablet Take 150 mg by mouth 2 (two) times daily.    [provider]  cholecalciferol (VITAMIN D) 1000 UNITS tablet Take 1,000 Units by mouth daily.    [provider]  fexofenadine-pseudoephedrine (ALLEGRA-D 24) 180-240 MG per 24 hr tablet Take 1 tablet by mouth daily.    [provider]  fluticasone (FLONASE) 50 MCG/ACT nasal spray Place 1 spray into both nostrils 2 (two) times daily as needed for allergies or rhinitis.    [provider]  losartan (COZAAR) 25 MG tablet Take 25 mg by mouth daily.    [provider]  Multiple Vitamin (MULTIVITAMIN WITH MINERALS) TABS tablet Take 1 tablet by mouth daily.    [provider]  oxyCODONE-acetaminophen (PERCOCET) 5-325 MG per tablet Take 1 tablet by mouth every 6 (six) hours as needed. 07/28/14   Bethann Berkshire, MD  Pitavastatin Calcium (LIVALO) 4 MG TABS Take 4 mg by mouth at bedtime.     [provider]  SYMBICORT 160-4.5 MCG/ACT inhaler INHALE 2 PUFFS INTO THE  LUNGS TWICE DAILY 12/04/14   Nyoka CowdenWert, Michael B, MD    Family History Family History  Problem Relation Age of Onset  . Heart disease Father     Social History Social History   Tobacco Use  . Smoking status: Never Smoker  . Smokeless tobacco: Never Used  Substance Use Topics  . Alcohol use: No  . Drug use: No     Allergies   Patient has no known allergies.   Review of Systems Review of Systems  Constitutional: Positive for appetite change, chills, fatigue and fever.  HENT: Positive for congestion, rhinorrhea and sore throat.   Respiratory: Positive for cough and  shortness of breath.   Musculoskeletal: Positive for myalgias.  All other systems reviewed and are negative.    Physical Exam Updated Vital Signs BP 125/77 (BP Location: Right Arm)   Pulse 88   Temp 99.5 F (37.5 C) (Oral)   Resp (!) 24   Ht 5\' 9"  (1.753 m)   Wt 90.4 kg (199 lb 4.8 oz)   SpO2 98%   BMI 29.43 kg/m   Physical Exam  Constitutional: He is oriented to person, place, and time. He appears well-developed and well-nourished.  HENT:  Head: Atraumatic.  Mouth/Throat: Oropharynx is clear and moist.  Eyes: Conjunctivae are normal.  Neck: Neck supple.  Cardiovascular: Normal rate and regular rhythm.  Pulmonary/Chest: Effort normal. He exhibits tenderness.  Abdominal: Soft. Bowel sounds are normal.  Musculoskeletal: Normal range of motion.  Lymphadenopathy:    He has no cervical adenopathy.  Neurological: He is alert and oriented to person, place, and time.  Skin: Skin is warm and dry. No rash noted.  Psychiatric: He has a normal mood and affect.  Nursing note and vitals reviewed.    ED Treatments / Results  Labs (all labs ordered are listed, but only abnormal results are displayed) Labs Reviewed - No data to display  EKG  EKG Interpretation None       Radiology Dg Chest 2 View  Result Date: 06/07/2017 CLINICAL DATA:  Cough, fever for 4 days EXAM: CHEST  2 VIEW COMPARISON:  06/03/2017 FINDINGS: The heart size and mediastinal contours are within normal limits. Both lungs are clear. The visualized skeletal structures are unremarkable. IMPRESSION: No active cardiopulmonary disease. Electronically Signed   By: Elige KoHetal  Patel   On: 06/07/2017 14:32    Procedures Procedures (including critical care time)  Medications Ordered in ED Medications - No data to display   Initial Impression / Assessment and Plan / ED Course  I have reviewed the triage vital signs and the nursing notes.  Pertinent labs & imaging results that were available during my care of the  patient were reviewed by me and considered in my medical decision making (see chart for details).     Discussed patient with Dr. Anitra LauthPlunkett. Will  initiate treatment with doxycycline.   Pt symptoms consistent with URI. CXR negative for acute infiltrate. Pt will be discharged with symptomatic treatment.  Discussed return precautions.  Pt is hemodynamically stable & in NAD prior to discharge.  Final Clinical Impressions(s) / ED Diagnoses   Final diagnoses:  Cough  Acute upper respiratory infection    ED Discharge Orders        Ordered    doxycycline (VIBRAMYCIN) 100 MG capsule  2 times daily     06/07/17 1817    chlorpheniramine-HYDROcodone (TUSSIONEX PENNKINETIC ER) 10-8 MG/5ML SUER  Every 12 hours PRN     06/07/17 1817  Felicie MornSmith, Bhavana Kady, NP 06/08/17 [id number]    Gwyneth SproutPlunkett, Whitney, MD 06/08/17 0100

## 2018-08-07 ENCOUNTER — Ambulatory Visit (INDEPENDENT_AMBULATORY_CARE_PROVIDER_SITE_OTHER): Payer: 59 | Admitting: Internal Medicine

## 2018-08-07 ENCOUNTER — Encounter: Payer: Self-pay | Admitting: Internal Medicine

## 2018-08-07 ENCOUNTER — Ambulatory Visit (INDEPENDENT_AMBULATORY_CARE_PROVIDER_SITE_OTHER)
Admission: RE | Admit: 2018-08-07 | Discharge: 2018-08-07 | Disposition: A | Discharge status: Home / Self Care | Payer: 59 | Source: Ambulatory Visit | Attending: Internal Medicine | Admitting: Internal Medicine

## 2018-08-07 ENCOUNTER — Ambulatory Visit: Payer: BLUE CROSS/BLUE SHIELD | Admitting: Internal Medicine

## 2018-08-07 VITALS — BP 118/82 | HR 96 | Ht 69.0 in | Wt 213.8 lb

## 2018-08-07 DIAGNOSIS — Z23 Encounter for immunization: Secondary | ICD-10-CM

## 2018-08-07 DIAGNOSIS — J4530 Mild persistent asthma, uncomplicated: Secondary | ICD-10-CM

## 2018-08-07 DIAGNOSIS — J479 Bronchiectasis, uncomplicated: Secondary | ICD-10-CM | POA: Diagnosis not present

## 2018-08-07 MED ORDER — PREDNISONE 10 MG PO TABS: ORAL_TABLET | ORAL | 0 refills | Status: DC

## 2018-08-07 MED ORDER — BUDESONIDE-FORMOTEROL FUMARATE 80-4.5 MCG/ACT IN AERO: 2.0000 | INHALATION_SPRAY | Freq: Two times a day (BID) | RESPIRATORY_TRACT | 11 refills | Status: DC

## 2018-08-07 NOTE — Patient Instructions (Addendum)
Pneumovax today then again at age 58   Plan A = Automatic = Symbicort 80 Take 2 puffs first thing in am and then another 2 puffs about 12 hours later.   Work on inhaler technique:  relax and gently blow all the way out then take a nice smooth deep breath back in, triggering the inhaler at same time you start breathing in.  Hold for up to 5 seconds if you can. Blow out thru nose. Rinse and gargle with water when done     Plan B = Backup Only use your albuterol inhaler as a rescue medication to be used if you can't catch your breath by resting or doing a relaxed purse lip breathing pattern.  - The less you use it, the better it will work when you need it. - Ok to use the inhaler up to 2 puffs  every 4 hours if you must but call for appointment if use goes up over your usual need - Don't leave home without it !!  (think of it like the spare tire for your car)    Prilosec 20 mg Take 30- 60 min before your first and last meals of the day   GERD (REFLUX)  is an extremely common cause of respiratory symptoms just like yours , many times with no obvious heartburn at all.    It can be treated with medication, but also with lifestyle changes including elevation of the head of your bed (ideally with 6 -8inch blocks under the headboard of your bed),  Smoking cessation, avoidance of late meals, excessive alcohol, and avoid fatty foods, chocolate, peppermint, colas, red wine, and acidic juices such as orange juice.  NO MINT OR MENTHOL PRODUCTS SO NO COUGH DROPS  USE SUGARLESS CANDY INSTEAD (Jolley ranchers or Stover's or Life Savers) or even ice chips will also do - the key is to swallow to prevent all throat clearing. NO OIL BASED VITAMINS - use powdered substitutes.  Avoid fish oil when coughing.   Please remember to go to the  x-ray department  for your tests - we will call you with the results when they are available     Please schedule a follow up office visit in 4 weeks, sooner if needed  - late  add:  Prednisone 10 mg take  4 each am x 2 days,   2 each am x 2 days,  1 each am x 2 days and stop

## 2018-08-07 NOTE — Progress Notes (Signed)
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    History of Present Illness  06/18/2014 1st [facility] office visit/ Wert   Chief Complaint  Patient presents with  . Pulmonary Consult    Self referral. Pt states that he has had PNA x 3 in the past year.  He c/o cough and SOB since Sept 2015.  He states that he gets SOB with minimal exertion and "takes short breaths at rest". His cough is non prod at this time. He c/o sore throat just since this am. He is using proair about once per day and neb with albuterol 2 x per day.   onset was insidious p started new job 2 y prior to OV  Cough non prod first then worsening sob Albuterol helps some/ prednisone helps a lot and saba  not needed as much when on vacation  Does better at home or vacation but still cough and sob with exertion. rec Symbicort 80 Take 2 puffs first thing in am and then another 2 puffs about 12 hours later.  only use your albuterol (proair)  As rescue  GERD  Diet  Try prilosec 20mg  Take 30-60 min before first meal of the day and Pepcid 20 mg one at  bedtime until return Please remember to go to the lab department downstairs for your tests - we will call you with the results when they are available.    07/22/2014 f/u ov/Wert re: symbicort 80 2bid not as good about using the prilosec/pepcid and no noct cough now Chief Complaint  Patient presents with  . Follow-up    PFT done today. Cough has resolved. His breathing is unchanged. He has not had to use albuterol neb or inhaler in the past wk.   thinks breathing/ coughing better as not working much over the holidays - has  respirator for when returns to work  rec Symbicort 160 Take 2 puffs first thing in am and then another 2 puffs about 12 hours later  Only use your albuterol as a rescue medication  If not satisfied > singulair 10 mg per day and then do sinus ct to decide re  allergy evaluation vs ent eval  Late add : full pneumococcal pna rx     08/07/2018  New pt eval / cough turned yellow / worse at work when lots of passive smoking  Chief Complaint  Patient presents with  . Pulmonary Consult    Self referral. Seen here before- last ov 07/22/2014. Pt c/o cough for the past 2 months- prod with clear to yellow sputum.  He also c/o SOB with or without any exertion.   Dyspnea:  wose x 8 weeks  Cough: ever since last eval,  better sleeping at 45 degrees but last 8 weeks more productive worse in ams minimally discolored  Sleeping:  In recliner/ o/w choking sensation immediately  SABA use: uses twice a day >    No obvious day to day or daytime variability or assoc excess/ purulent sputum or mucus plugs or hemoptysis or cp or chest tightness, subjective wheeze or overt sinus or hb symptoms.    Also  denies any obvious fluctuation of symptoms with weather or environmental changes or other aggravating or alleviating factors except as outlined above   No unusual exposure hx or h/o childhood pna/ asthma or knowledge of premature birth.  Current Allergies, Complete Past Medical History, Past Surgical History, Family History, and Social History were reviewed in [facility] electronic medical record.  ROS  The following are not active complaints unless bolded Hoarseness, sore throat, dysphagia, dental problems, itching, sneezing,  nasal congestion or discharge of excess mucus or purulent secretions, ear ache,   fever, chills, sweats, unintended wt loss or wt gain, classically pleuritic or exertional cp,  orthopnea pnd or arm/hand swelling  or leg swelling, presyncope, palpitations, abdominal pain, anorexia, nausea,  vomiting, diarrhea  or change in bowel habits or change in bladder habits, change in stools or change in urine, dysuria, hematuria,  rash, arthralgias, visual complaints, headache, numbness, weakness or ataxia or problems with walking or coordination,  change in mood or  memory.        Current Meds  Medication Sig  . albuterol (PROVENTIL HFA;VENTOLIN HFA) 108 (90 BASE) MCG/ACT inhaler Inhale 1 puff into the lungs every 6 (six) hours as needed for wheezing or shortness of breath.  Marland Kitchen. albuterol (PROVENTIL) (2.5 MG/3ML) 0.083% nebulizer solution Take 2.5 mg by nebulization every 6 (six) hours as needed for wheezing or shortness of breath.  . Ascorbic Acid (VITAMIN C) 1000 MG tablet Take 1,000 mg by mouth daily.  Marland Kitchen. buPROPion (WELLBUTRIN XL) 150 MG 24 hr tablet Take 150 mg by mouth 2 (two) times daily.  . chlorpheniramine-HYDROcodone (TUSSIONEX PENNKINETIC ER) 10-8 MG/5ML SUER Take 5 mLs by mouth every 12 (twelve) hours as needed for cough.  . cholecalciferol (VITAMIN D) 1000 UNITS tablet Take 1,000 Units by mouth daily.  Marland Kitchen. losartan (COZAAR) 25 MG tablet Take 25 mg by mouth daily.  . Multiple Vitamin (MULTIVITAMIN WITH MINERALS) TABS tablet Take 1 tablet by mouth daily.  . pravastatin (PRAVACHOL) 40 MG tablet Take 40 mg by mouth daily.                Objective:   Physical Exam  amb wm nad   Vital signs reviewed - Note on arrival 02 sats  94% on RA     08/07/2018        213  07/22/2014        210    06/18/14 203 lb (92.08 kg)  06/07/14 201 lb 1 oz (91.2 kg)  07/05/11 210 lb (95.255 kg)       HEENT: nl dentition, turbinates bilaterally, and oropharynx. Nl external ear canals without cough reflex   NECK :  without JVD/Nodes/TM/ nl carotid upstrokes bilaterally   LUNGS: no acc muscle use,  Nl contour chest which is clear to A and P bilaterally without cough on insp or exp maneuvers   CV:  RRR  no s3 or murmur or increase in P2, and no edema   ABD:  Obese/ soft and nontender with  nl inspiratory excursion in the supine position. No bruits or organomegaly appreciated, bowel sounds nl  MS:  Nl gait/ ext warm without deformities, calf tenderness, cyanosis or clubbing No obvious joint restrictions   SKIN: warm and dry without lesions    NEURO:  alert, approp, nl sensorium with  no motor or cerebellar deficits apparent.       CXR PA and Lateral:   08/07/2018 :    I personally reviewed images and agree with radiology impression  as follows:    No active cardiopulmonary disease.      Assessment & Plan:

## 2018-08-08 ENCOUNTER — Encounter: Payer: Self-pay | Admitting: Internal Medicine

## 2018-08-08 NOTE — Assessment & Plan Note (Signed)
Pneumovax now and in 5 years

## 2018-08-08 NOTE — Assessment & Plan Note (Addendum)
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  - 06/18/2014   trial of symbicort 80 2bid  - Allergy profile 06/18/2014 >  Eos 3.6 % IgE 728 cat = dog > dust/ mold  - PFTs 07/22/2014  FEV1  3.77 ( 103%) ratio 92 p 28% improvement despite symbicort 80 x 2 in am so increased to symbicort 160 2bid  -  08/07/2018  After extensive coaching inhaler device,  effectiveness =    75%> resume symb 80 2bid  DDX of  difficult airways management almost all start with A and  include Adherence, Ace Inhibitors, Acid Reflux, Active Sinus Disease, Alpha 1 Antitripsin deficiency, Anxiety masquerading as Airways dz,  ABPA,  Allergy(esp in young), Aspiration (esp in elderly), Adverse effects of meds,  Active smoking or vaping, A bunch of PE's (a small clot burden can't cause this syndrome unless there is already severe underlying pulm or vascular dz with poor reserve) plus two Bs  = Bronchiectasis and Beta blocker use..and one C= CHF   Adherence is always the initial "prime suspect" and is a multilayered concern that requires a "trust but verify" approach in every patient - starting with knowing how to use medications, especially inhalers, correctly, keeping up with refills and understanding the fundamental difference between maintenance and prns vs those medications only taken for a very short course and then stopped and not refilled.  - see hfa teaching - return with all meds in hand using a trust but verify approach to confirm accurate Medication  Reconciliation The principal here is that until we are certain that the  patients are doing what we've asked, it makes no sense to ask them to do more.   ? Acid (or non-acid) GERD > always difficult to exclude as up to 75% of pts in some series report no assoc GI/ Heartburn symptoms> rec max (24h)  acid suppression and diet restrictions/ reviewed and instructions given in writing.   ? Allergy > see profile, avoidance for now / Prednisone 10 mg take  4 each am x 2 days,   2 each am x 2  days,  1 each am x 2 days and stop then  consider adding singulair next if flares on symbicort maint   ? Adverse drug effects > none listed though note For reasons that may related to vascular permability and nitric oxide pathways but not elevated  bradykinin levels (as seen with  ACEi use) losartan in the generic form has been reported now from mulitple sources  to cause a similar pattern of non-specific  upper airway symptoms as seen with acei.   This has not been reported with exposure to the other ARB's to date, so it may be necessary to try either generic diovan or avapro if ARB needed or use an alternative class altogether if cough proves refractory  See:  Ann Allergy Asthma Immunol  2008: 101: p [phone]    ? Bronchiectasis > See ct 06/05/14 for mild bibasilar bronchiectasis/ for now hold abx and see if responds to measures to improve mc function (symbicort)    ? chf > not apparent by exam/cxr    Advised:  formulary restrictions will be an ongoing challenge for the forseable future and I would be happy to pick an alternative if the pt will first  provide me a list of them -  pt  will need to return here for training for any new device that is required eg dpi vs hfa vs respimat.    In  the meantime we can always provide samples so that the patient never runs out of any needed respiratory medications.       F/u in 4 weeks to regroup    Total time devoted to counseling  > 50 % of initial 60 min office visit:  review case with pt/ discussion of options/alternatives/ personally creating written customized instructions  in presence of pt  then going over those specific  Instructions directly with the pt including how to use all of the meds but in particular covering each new medication in detail and the difference between the maintenance= "automatic" meds and the prns using an action plan format for the latter (If this problem/symptom => do that organization reading Left to right).  Please see AVS  from this visit for a full list of these instructions which I personally wrote for this pt and  are unique to this visit.   See device teaching which extended face to face time for this visit

## 2018-08-08 NOTE — Progress Notes (Signed)
ATC, NA and no option to leave msg 

## 2018-08-09 NOTE — Progress Notes (Signed)
ATC, NA and no option to leave msg 

## 2018-08-10 NOTE — Progress Notes (Signed)
ATC, NA 

## 2018-09-06 ENCOUNTER — Other Ambulatory Visit: Payer: Self-pay

## 2018-09-06 ENCOUNTER — Ambulatory Visit (INDEPENDENT_AMBULATORY_CARE_PROVIDER_SITE_OTHER): Payer: 59 | Admitting: Internal Medicine

## 2018-09-06 ENCOUNTER — Encounter: Payer: Self-pay | Admitting: Internal Medicine

## 2018-09-06 VITALS — BP 112/76 | HR 84 | Ht 69.0 in | Wt 213.0 lb

## 2018-09-06 DIAGNOSIS — J4530 Mild persistent asthma, uncomplicated: Secondary | ICD-10-CM

## 2018-09-06 DIAGNOSIS — J310 Chronic rhinitis: Secondary | ICD-10-CM

## 2018-09-06 LAB — NITRIC OXIDE: Nitric Oxide: 44

## 2018-09-06 MED ORDER — BUDESONIDE-FORMOTEROL FUMARATE 160-4.5 MCG/ACT IN AERO: 2.0000 | INHALATION_SPRAY | Freq: Two times a day (BID) | RESPIRATORY_TRACT | 11 refills | Status: DC

## 2018-09-06 MED ORDER — BUDESONIDE-FORMOTEROL FUMARATE 160-4.5 MCG/ACT IN AERO: 2.0000 | INHALATION_SPRAY | Freq: Two times a day (BID) | RESPIRATORY_TRACT | 0 refills | Status: DC

## 2018-09-06 NOTE — Assessment & Plan Note (Signed)
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 when -  08/07/2018   resume symb 80 2bid > improved about 50% clinically - FENO 09/06/2018  =   44 on symb 80 2 bid with good hfa - 09/06/2018  After extensive coaching inhaler device,  effectiveness =    90% so increased symb to 160 2bid    Assoc with atopic rast panel, persistently elevated feno on low dose ICS/laba and persistent cough but improved doe.   Issue is the cough may be also related to UACS so higher doses of ICS may backfire but based on moderately high feno needs trial of symb 160 2bid and if can't tolerate then add singulair to symb 80

## 2018-09-06 NOTE — Progress Notes (Signed)
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    History of Present Illness  06/18/2014 1st [facility] office visit/ Cadin Luka   Chief Complaint  Patient presents with  . Pulmonary Consult    Self referral. Pt states that he has had PNA x 3 in the past year.  He c/o cough and SOB since Sept 2015.  He states that he gets SOB with minimal exertion and "takes short breaths at rest". His cough is non prod at this time. He c/o sore throat just since this am. He is using proair about once per day and neb with albuterol 2 x per day.   onset was insidious p started new job 2 y prior to OV  Cough non prod first then worsening sob Albuterol helps some/ prednisone helps a lot and saba  not needed as much when on vacation  Does better at home or vacation but still cough and sob with exertion. rec Symbicort 80 Take 2 puffs first thing in am and then another 2 puffs about 12 hours later.  only use your albuterol (proair)  As rescue  GERD  Diet  Try prilosec  Take 30-60 min before first meal of the day and Pepcid 20 mg one at  bedtime until return Please remember to go to the lab department downstairs for your tests - we will call you with the results when they are available.    07/22/2014 f/u ov/Alie Moudy re: symbicort 80 2bid not as good about using the prilosec/pepcid and no noct cough now Chief Complaint  Patient presents with  . Follow-up    PFT done today. Cough has resolved. His breathing is unchanged. He has not had to use albuterol neb or inhaler in the past wk.   thinks breathing/ coughing better as not working much over the holidays - has  respirator for when returns to work  rec Symbicort 160 Take 2 puffs first thing in am and then another 2 puffs about 12 hours later  Only use your albuterol as a rescue medication  If not satisfied > singulair 10 mg per day and then do sinus ct to decide re  allergy evaluation vs ent eval  Late add : full pneumococcal pna rx     08/07/2018  New pt eval / cough turned yellow / worse at work when lots of passive smoking  Chief Complaint  Patient presents with  . Pulmonary Consult    Self referral. Seen here before- last ov 07/22/2014. Pt c/o cough for the past 2 months- prod with clear to yellow sputum.  He also c/o SOB with or without any exertion.   Dyspnea:  worse x 8 weeks  Cough: ever since last eval,  better sleeping at 45 degrees but last 8 weeks more productive worse in ams minimally discolored  Sleeping:  In recliner/ o/w choking sensation immediately  SABA use: uses twice a day  rec Pneumovax today then again at age 89  Plan A = Automatic = Symbicort 80 Take 2 puffs first thing in am and then another 2 puffs about 12 hours later.  Work on inhaler technique:  Plan B = Backup Only use your albuterol inhaler   Prilosec 20 mg Take 30- 60 min before your first and last meals of the day  GERD diet  - late add:  Prednisone 10 mg take  4 each am x 2 days,   2 each am x 2 days,  1 each am x 2 days and stop     09/06/2018  f/u ov/Lupe Bonner re: mild chronic asthma/chronic rhinitis/ allergy to dog but still sleeps in br  Chief Complaint  Patient presents with  . Follow-up    Breathing has improved and he is coughing less. His cough is usually prod with clear to yellow sputum. He has not had to use his albuterol inhaler or neb since the last visit.    Dyspnea:  Not limited by breathing from desired activities  / hips slow down > sob  Cough:   Attributes to  nasal drainage, severe nasal congestion each am with slt discolored mucus Sleeping: still at 45 degreees  SABA use: not using 02:  none     No obvious day to day or daytime variability or assoc excess/ purulent sputum or mucus plugs or hemoptysis or cp or chest tightness, subjective wheeze or overt   hb symptoms.   Sleeping  without nocturnal    exacerbation  of respiratory  c/o's or need for noct saba. Also denies any obvious fluctuation of symptoms with weather or environmental changes or other aggravating or alleviating factors except as outlined above   No unusual exposure hx or h/o childhood pna/ asthma or knowledge of premature birth.  Current Allergies, Complete Past Medical History, Past Surgical History, Family History, and Social History were reviewed in [employer] record.  ROS  The following are not active complaints unless bolded Hoarseness, sore throat, dysphagia, dental problems, itching, sneezing,  nasal congestion or discharge of excess mucus or purulent secretions, ear ache,   fever, chills, sweats, unintended wt loss or wt gain, classically pleuritic or exertional cp,  orthopnea pnd or arm/hand swelling  or leg swelling, presyncope, palpitations, abdominal pain, anorexia, nausea, vomiting, diarrhea  or change in bowel habits or change in bladder habits, change in stools or change in urine, dysuria, hematuria,  rash, arthralgias, visual complaints, headache, numbness, weakness or ataxia or problems with walking or coordination,  change in mood or  memory.        Current Meds  Medication Sig  . albuterol (PROVENTIL HFA;VENTOLIN HFA) 108 (90 BASE) MCG/ACT inhaler Inhale 1 puff into the lungs every 6 (six) hours as needed for wheezing or shortness of breath.  Marland Kitchen albuterol (PROVENTIL) (2.5 MG/3ML) 0.083% nebulizer solution Take 2.5 mg by nebulization every 6 (six) hours as needed for wheezing or shortness of breath.  . Ascorbic Acid (VITAMIN C) 1000 MG tablet Take 1,000 mg by mouth daily.  . budesonide-formoterol (SYMBICORT) 80-4.5 MCG/ACT inhaler Inhale 2 puffs into the lungs 2 (two)  times daily.  Marland Kitchen buPROPion (WELLBUTRIN XL) 150 MG 24 hr tablet Take 150 mg by mouth 2 (two) times daily.  . chlorpheniramine-HYDROcodone (TUSSIONEX PENNKINETIC ER) 10-8 MG/5ML SUER Take 5 mLs by mouth every 12 (twelve) hours as needed for cough.  . cholecalciferol (VITAMIN D) 1000 UNITS tablet Take 1,000 Units by mouth daily.  Marland Kitchen losartan (COZAAR) 25 MG tablet Take 25 mg by mouth daily.  . Multiple Vitamin (MULTIVITAMIN WITH MINERALS) TABS tablet Take 1 tablet by mouth daily.  Marland Kitchen omeprazole (PRILOSEC OTC) 20 MG tablet Take 20 mg by  mouth 2 (two) times daily before a meal.  . pravastatin (PRAVACHOL) 40 MG tablet Take 40 mg by mouth daily.              Objective:   Physical Exam  amb wm nad  Vital signs reviewed - Note on arrival 02 sats  94% on RA    09/06/2018        213  08/07/2018        213  07/22/2014        210    06/18/14 203 lb (92.08 kg)  06/07/14 201 lb 1 oz (91.2 kg)  07/05/11 210 lb (95.255 kg)       HEENT: Edentulous / nl  oropharynx. Nl external ear canals without cough reflex - mod bilateral non-specific turbinate edema     NECK :  without JVD/Nodes/TM/ nl carotid upstrokes bilaterally   LUNGS: no acc muscle use,  Nl contour chest which is clear to A and P bilaterally without cough on insp or exp maneuvers   CV:  RRR  no s3 or murmur or increase in P2, and no edema   ABD:  Quite obese/ nontender with nl inspiratory excursion in the supine position. No bruits or organomegaly appreciated, bowel sounds nl  MS:  Nl gait/ ext warm without deformities, calf tenderness, cyanosis or clubbing No obvious joint restrictions   SKIN: warm and dry without lesions    NEURO:  alert, approp, nl sensorium with  no motor or cerebellar deficits apparent.                Assessment & Plan:

## 2018-09-06 NOTE — Patient Instructions (Signed)
Increase symbicort to 160 Take 2 puffs first thing in am and then another 2 puffs about 12 hours later.   Work on perfect  inhaler technique:  relax and gently blow all the way out then take a nice smooth deep breath back in, triggering the inhaler at same time you start breathing in.  Hold for up to 5 seconds if you can. Blow out thru nose. Brush tongue with arm and hammer toothpast, rinse and gargle after use   Keep dog out of bedroom   Please see patient coordinator before you leave today  to schedule ENT eval for chronic severe nasal congestion to see if sinus CT warranted as your insurance won't approve one without their input   Please schedule a follow up visit in 3 months but call sooner if needed

## 2018-09-06 NOTE — Assessment & Plan Note (Signed)
Referred to ENT 09/06/2018  His insurance won't cover sinus CT so next step is ent eval for severe am nasal congestion chronically.    Discussed in detail all the  indications, usual  risks and alternatives  relative to the benefits with patient who agrees to proceed with w/u as outlined.     I had an extended discussion with the patient and wife reviewing all relevant studies completed to date and  lasting 15 to 20 minutes of a 25 minute visit    See device teaching which extended face to face time for this visit.  Each maintenance medication was reviewed in detail including emphasizing most importantly the difference between maintenance and prns and under what circumstances the prns are to be triggered using an action plan format that is not reflected in the computer generated alphabetically organized AVS which I have not found useful in most complex patients, especially with respiratory illnesses  Please see AVS for specific instructions unique to this visit that I personally wrote and verbalized to the the pt in detail and then reviewed with pt  by my nurse highlighting any  changes in therapy recommended at today's visit to their plan of care.

## 2018-12-11 ENCOUNTER — Ambulatory Visit: Payer: 59 | Admitting: Internal Medicine

## 2019-01-16 ENCOUNTER — Encounter: Payer: Self-pay | Admitting: Internal Medicine

## 2019-01-16 ENCOUNTER — Ambulatory Visit (INDEPENDENT_AMBULATORY_CARE_PROVIDER_SITE_OTHER): Payer: 59 | Admitting: Internal Medicine

## 2019-01-16 ENCOUNTER — Other Ambulatory Visit: Payer: Self-pay

## 2019-01-16 DIAGNOSIS — J4530 Mild persistent asthma, uncomplicated: Secondary | ICD-10-CM

## 2019-01-16 DIAGNOSIS — J310 Chronic rhinitis: Secondary | ICD-10-CM | POA: Diagnosis not present

## 2019-01-16 NOTE — Patient Instructions (Signed)
No change medications  Please schedule a follow up visit in 3 months but call sooner if needed  

## 2019-01-16 NOTE — Progress Notes (Signed)
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    History of Present Illness  06/18/2014 1st [facility] office visit/ Dustin Randall   Chief Complaint  Patient presents with  . Pulmonary Consult    Self referral. Pt states that he has had PNA x 3 in the past year.  He c/o cough and SOB since Sept 2015.  He states that he gets SOB with minimal exertion and "takes short breaths at rest". His cough is non prod at this time. He c/o sore throat just since this am. He is using proair about once per day and neb with albuterol 2 x per day.   onset was insidious p started new job 2 y prior to OV  Cough non prod first then worsening sob Albuterol helps some/ prednisone helps a lot and saba  not needed as much when on vacation  Does better at home or vacation but still cough and sob with exertion. rec Symbicort 80 Take 2 puffs first thing in am and then another 2 puffs about 12 hours later.  only use your albuterol (proair)  As rescue  GERD  Diet  Try prilosec 20mg  Take 30-60 min before first meal of the day and Pepcid 20 mg one at  bedtime until return Please remember to go to the lab department downstairs for your tests - we will call you with the results when they are available.    07/22/2014 f/u ov/Dustin Randall re: symbicort 80 2bid not as good about using the prilosec/pepcid and no noct cough now Chief Complaint  Patient presents with  . Follow-up    PFT done today. Cough has resolved. His breathing is unchanged. He has not had to use albuterol neb or inhaler in the past wk.   thinks breathing/ coughing better as not working much over the holidays - has  respirator for when returns to work  rec Symbicort 160 Take 2 puffs first thing in am and then another 2 puffs about 12 hours later  Only use your albuterol as a rescue medication  If not satisfied > singulair 10 mg per day and then do sinus ct to decide re  allergy evaluation vs ent eval  Late add : full pneumococcal pna rx     08/07/2018  New pt eval / cough turned yellow / worse at work when lots of passive smoking  Chief Complaint  Patient presents with  . Pulmonary Consult    Self referral. Seen here before- last ov 07/22/2014. Pt c/o cough for the past 2 months- prod with clear to yellow sputum.  He also c/o SOB with or without any exertion.   Dyspnea:  worse x 8 weeks  Cough: ever since last eval,  better sleeping at 45 degrees but last 8 weeks more productive worse in ams minimally discolored  Sleeping:  In recliner/ o/w choking sensation immediately  SABA use: uses twice a day  rec Pneumovax today then again at age 58  Plan A = Automatic = Symbicort 80 Take 2 puffs first thing in am and then another 2 puffs about 12 hours later.  Work on inhaler technique:  Plan B = Backup Only use your albuterol inhaler   Prilosec 20 mg Take 30- 60 min before your first and last meals of the day  GERD diet  - late add:  Prednisone 10 mg take  4 each am x 2 days,   2 each am x 2 days,  1 each am x 2 days and stop     09/06/2018  f/u ov/Dustin Randall re: mild chronic asthma/chronic rhinitis/ allergy to dog but still sleeps in br  Chief Complaint  Patient presents with  . Follow-up    Breathing has improved and he is coughing less. His cough is usually prod with clear to yellow sputum. He has not had to use his albuterol inhaler or neb since the last visit.    Dyspnea:  Not limited by breathing from desired activities  / hips slow down > sob  Cough:   Attributes to  nasal drainage, severe nasal congestion each am with slt discolored mucus Sleeping: still at 45 degreees  SABA use: not using rec  Increase symbicort to 160 Take 2 puffs first thing in am and then another 2 puffs about 12 hours later.  Work on perfect  inhaler technique:   Keep dog out of bedroom  Please see patient coordinator before you leave today  to schedule ENT eval for chronic severe nasal congestion to see if sinus CT warranted as your insurance won't approve one without their input > never done/ improved on its own    01/16/2019  f/u ov/Dustin Randall re: mild chronic asthma  Chief Complaint  Patient presents with  . Follow-up    Breathing is overall doing well and he uses his albuterol once per wk on average.   Dyspnea:  Hips slow him down, back at work painting trucks Cough: variable related to sense of pnds  Sleeping: bed blocks  SABA use: no need for rescue 02: none    No obvious day to day or daytime variability or assoc excess/ purulent sputum or mucus plugs or hemoptysis or cp or chest tightness, subjective wheeze or overt sinus or hb symptoms.   Sleeping as above without nocturnal  or early am exacerbation  of respiratory  c/o's or need for noct saba. Also denies any obvious fluctuation of symptoms with weather or environmental changes or other aggravating or alleviating factors except as outlined above   No unusual exposure hx or h/o childhood pna/ asthma or knowledge of premature birth.  Current Allergies, Complete Past Medical History, Past Surgical History, Family History, and Social History were reviewed in [employer] record.  ROS  The following are not active complaints unless bolded Hoarseness, sore throat, dysphagia, dental problems, itching, sneezing,  nasal congestion or discharge of excess mucus or purulent secretions, ear ache,   fever, chills, sweats, unintended wt loss or wt gain, classically pleuritic or exertional cp,  orthopnea pnd or arm/hand swelling  or leg swelling, presyncope, palpitations, abdominal pain, anorexia, nausea, vomiting, diarrhea  or change in bowel  habits or change in bladder habits, change in stools or change in urine, dysuria, hematuria,  rash, arthralgias, visual complaints, headache, numbness, weakness or ataxia or problems with walking or coordination,  change in mood or  memory.        Current Meds  Medication Sig  . albuterol (PROVENTIL HFA;VENTOLIN HFA) 108 (90 BASE) MCG/ACT inhaler Inhale 1 puff into the lungs every 6 (six) hours as needed for wheezing or shortness of breath.  Marland Kitchen albuterol (PROVENTIL) (2.5 MG/3ML)  0.083% nebulizer solution Take 2.5 mg by nebulization every 6 (six) hours as needed for wheezing or shortness of breath.  . Ascorbic Acid (VITAMIN C) 1000 MG tablet Take 1,000 mg by mouth daily.  . budesonide-formoterol (SYMBICORT) 160-4.5 MCG/ACT inhaler Inhale 2 puffs into the lungs 2 (two) times daily.  Marland Kitchen. buPROPion (WELLBUTRIN XL) 150 MG 24 hr tablet Take 150 mg by mouth 2 (two) times daily.  . chlorpheniramine-HYDROcodone (TUSSIONEX PENNKINETIC ER) 10-8 MG/5ML SUER Take 5 mLs by mouth every 12 (twelve) hours as needed for cough.  . cholecalciferol (VITAMIN D) 1000 UNITS tablet Take 1,000 Units by mouth daily.  Marland Kitchen. losartan (COZAAR) 25 MG tablet Take 25 mg by mouth daily.  . Multiple Vitamin (MULTIVITAMIN WITH MINERALS) TABS tablet Take 1 tablet by mouth daily.  Marland Kitchen. omeprazole (PRILOSEC OTC) 20 MG tablet Take 20 mg by mouth 2 (two) times daily before a meal.  . pravastatin (PRAVACHOL) 40 MG tablet Take 40 mg by mouth daily.                   Objective:   Physical Exam   amb wm nad   Vital signs reviewed - Note on arrival 02 sats  95% on RA     01/16/2019        208  09/06/2018        213  08/07/2018        213  07/22/2014        210    06/18/14 203 lb (92.08 kg)  06/07/14 201 lb 1 oz (91.2 kg)  07/05/11 210 lb (95.255 kg)       HEENT: Edentulous/ nl oropharynx. Nl external ear canals without cough reflex -  Mild bilateral non-specific turbinate edema     NECK :  without JVD/Nodes/TM/ nl carotid  upstrokes bilaterally   LUNGS: no acc muscle use,  Mild barrel  contour chest wall with bilateral  Distant bs s audible wheeze and  without cough on insp or exp maneuver and mild  Hyperresonant  to  percussion bilaterally     CV:  RRR  no s3 or murmur or increase in P2, and no edema   ABD:  Quite obese/  nontender with pos end  insp Hoover's  in the supine position. No bruits or organomegaly appreciated, bowel sounds nl  MS:   Nl gait/  ext warm without deformities, calf tenderness, cyanosis or clubbing No obvious joint restrictions   SKIN: warm and dry without lesions    NEURO:  alert, approp, nl sensorium with  no motor or cerebellar deficits apparent.             Assessment & Plan:

## 2019-01-17 ENCOUNTER — Encounter: Payer: Self-pay | Admitting: Internal Medicine

## 2019-01-17 NOTE — Assessment & Plan Note (Addendum)
Allergy profile 06/18/2014 >  Eos 3.6 %  IgE 728 cat = dog > dust/ mold -Referred to ENT 09/06/2018 > never done   Reviewed allergy profile, avoidance measures/otc  No further rx at this point

## 2019-01-17 NOTE — Assessment & Plan Note (Signed)
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 when -  08/07/2018   resume symb 80 2bid > improved about 50% clinically - FENO 09/06/2018  =   44 on symb 80 2 bid with good hfa - 09/06/2018    increased symb to 160 2bid  - 01/16/2019  After extensive coaching inhaler device,  effectiveness =    90%    All goals of chronic asthma control met including optimal function and elimination of symptoms with minimal need for rescue therapy.  Contingencies discussed in full including contacting this office immediately if not controlling the symptoms using the rule of two's.      Pt informed of the seriousness of COVID 19 infection as a direct risk to their health  and safey and to those of their loved ones and should continue to wear facemask in public and minimize exposure to public locations but especially avoid any area or activity where non-close contacts are not observing distancing or wearing an appropriate face mask.    >>> f/u in 3 m, sooner if needed    I had an extended discussion with the patient reviewing all relevant studies completed to date and  lasting 15 to 20 minutes of a 25 minute visit    I performed detailed device teaching using a teach back method which extended face to face time for this visit (see above)  Each maintenance medication was reviewed in detail including emphasizing most importantly the difference between maintenance and prns and under what circumstances the prns are to be triggered using an action plan format that is not reflected in the computer generated alphabetically organized AVS which I have not found useful in most complex patients, especially with respiratory illnesses  Please see AVS for  specific instructions unique to this visit that I personally wrote and verbalized to the the pt in detail and then reviewed with pt  by my nurse highlighting any  changes in therapy recommended at today's visit to their plan of care.

## 2019-03-09 ENCOUNTER — Ambulatory Visit: Payer: 59 | Admitting: Internal Medicine

## 2019-03-18 ENCOUNTER — Emergency Department (HOSPITAL_COMMUNITY): Payer: Managed Care, Other (non HMO) | Admitting: Certified Registered"

## 2019-03-18 ENCOUNTER — Encounter (HOSPITAL_COMMUNITY)
Admission: EM | Disposition: A | Discharge status: Home / Self Care | Payer: Self-pay | Source: Home / Self Care | Attending: Emergency Medicine

## 2019-03-18 ENCOUNTER — Emergency Department (HOSPITAL_BASED_OUTPATIENT_CLINIC_OR_DEPARTMENT_OTHER): Payer: Managed Care, Other (non HMO)

## 2019-03-18 ENCOUNTER — Observation Stay (HOSPITAL_BASED_OUTPATIENT_CLINIC_OR_DEPARTMENT_OTHER)
Admission: EM | Admit: 2019-03-18 | Discharge: 2019-03-19 | Disposition: A | Discharge status: Home / Self Care | Payer: Managed Care, Other (non HMO) | Attending: General Surgery | Admitting: General Surgery

## 2019-03-18 ENCOUNTER — Encounter (HOSPITAL_BASED_OUTPATIENT_CLINIC_OR_DEPARTMENT_OTHER): Payer: Self-pay | Admitting: Emergency Medicine

## 2019-03-18 ENCOUNTER — Other Ambulatory Visit: Payer: Self-pay

## 2019-03-18 DIAGNOSIS — K3589 Other acute appendicitis without perforation or gangrene: Secondary | ICD-10-CM

## 2019-03-18 DIAGNOSIS — Z20828 Contact with and (suspected) exposure to other viral communicable diseases: Secondary | ICD-10-CM | POA: Diagnosis not present

## 2019-03-18 DIAGNOSIS — I10 Essential (primary) hypertension: Secondary | ICD-10-CM | POA: Diagnosis not present

## 2019-03-18 DIAGNOSIS — K3533 Acute appendicitis with perforation and localized peritonitis, with abscess: Secondary | ICD-10-CM | POA: Diagnosis not present

## 2019-03-18 DIAGNOSIS — Z96643 Presence of artificial hip joint, bilateral: Secondary | ICD-10-CM | POA: Insufficient documentation

## 2019-03-18 DIAGNOSIS — Z8673 Personal history of transient ischemic attack (TIA), and cerebral infarction without residual deficits: Secondary | ICD-10-CM | POA: Diagnosis not present

## 2019-03-18 DIAGNOSIS — Z79899 Other long term (current) drug therapy: Secondary | ICD-10-CM | POA: Diagnosis not present

## 2019-03-18 DIAGNOSIS — J4530 Mild persistent asthma, uncomplicated: Secondary | ICD-10-CM | POA: Diagnosis not present

## 2019-03-18 DIAGNOSIS — K3580 Unspecified acute appendicitis: Secondary | ICD-10-CM | POA: Diagnosis present

## 2019-03-18 DIAGNOSIS — F329 Major depressive disorder, single episode, unspecified: Secondary | ICD-10-CM | POA: Diagnosis not present

## 2019-03-18 HISTORY — PX: LAPAROSCOPIC APPENDECTOMY: SHX408

## 2019-03-18 LAB — CBC WITH DIFFERENTIAL/PLATELET
Abs Immature Granulocytes: 0.06 10*3/uL (ref 0.00–0.07)
Basophils Absolute: 0 10*3/uL (ref 0.0–0.1)
Basophils Relative: 0 %
Eosinophils Absolute: 0.1 10*3/uL (ref 0.0–0.5)
Eosinophils Relative: 1 %
HCT: 48.2 % (ref 39.0–52.0)
Hemoglobin: 15.5 g/dL (ref 13.0–17.0)
Immature Granulocytes: 0 %
Lymphocytes Relative: 5 %
Lymphs Abs: 0.8 10*3/uL (ref 0.7–4.0)
MCH: 29.9 pg (ref 26.0–34.0)
MCHC: 32.2 g/dL (ref 30.0–36.0)
MCV: 93.1 fL (ref 80.0–100.0)
Monocytes Absolute: 1.1 10*3/uL — ABNORMAL HIGH (ref 0.1–1.0)
Monocytes Relative: 8 %
Neutro Abs: 12.4 10*3/uL — ABNORMAL HIGH (ref 1.7–7.7)
Neutrophils Relative %: 86 %
Platelets: 209 10*3/uL (ref 150–400)
RBC: 5.18 MIL/uL (ref 4.22–5.81)
RDW: 13 % (ref 11.5–15.5)
WBC: 14.5 10*3/uL — ABNORMAL HIGH (ref 4.0–10.5)
nRBC: 0 % (ref 0.0–0.2)

## 2019-03-18 LAB — URINALYSIS, ROUTINE W REFLEX MICROSCOPIC
Bilirubin Urine: NEGATIVE
Glucose, UA: NEGATIVE mg/dL
Hgb urine dipstick: NEGATIVE
Ketones, ur: 15 mg/dL — AB
Leukocytes,Ua: NEGATIVE
Nitrite: NEGATIVE
Protein, ur: NEGATIVE mg/dL
Specific Gravity, Urine: >1.03 — ABNORMAL HIGH (ref 1.005–1.030)
pH: 5.5 (ref 5.0–8.0)

## 2019-03-18 LAB — COMPREHENSIVE METABOLIC PANEL
ALT: 21 U/L (ref 0–44)
AST: 19 U/L (ref 15–41)
Albumin: 4.1 g/dL (ref 3.5–5.0)
Alkaline Phosphatase: 70 U/L (ref 38–126)
Anion gap: 11 (ref 5–15)
BUN: 22 mg/dL — ABNORMAL HIGH (ref 6–20)
CO2: 24 mmol/L (ref 22–32)
Calcium: 9.1 mg/dL (ref 8.9–10.3)
Chloride: 103 mmol/L (ref 98–111)
Creatinine, Ser: 1.11 mg/dL (ref 0.61–1.24)
GFR calc Af Amer: >60 mL/min (ref >60)
GFR calc non Af Amer: >60 mL/min (ref >60)
Glucose, Bld: 113 mg/dL — ABNORMAL HIGH (ref 70–99)
Potassium: 4.2 mmol/L (ref 3.5–5.1)
Sodium: 138 mmol/L (ref 135–145)
Total Bilirubin: 0.8 mg/dL (ref 0.3–1.2)
Total Protein: 7.5 g/dL (ref 6.5–8.1)

## 2019-03-18 LAB — SARS CORONAVIRUS 2 BY RT PCR (HOSPITAL ORDER, PERFORMED IN ~~LOC~~ HOSPITAL LAB): SARS Coronavirus 2: NEGATIVE

## 2019-03-18 SURGERY — APPENDECTOMY, LAPAROSCOPIC
Anesthesia: General

## 2019-03-18 MED ORDER — PIPERACILLIN-TAZOBACTAM 3.375 G IVPB
3.3750 g | INTRAVENOUS | Status: AC
  Administered 2019-03-18: 3.375 g via INTRAVENOUS

## 2019-03-18 MED ORDER — SUGAMMADEX SODIUM 200 MG/2ML IV SOLN
INTRAVENOUS | Status: DC | PRN
  Administered 2019-03-18: 200 mg via INTRAVENOUS

## 2019-03-18 MED ORDER — SIMETHICONE 80 MG PO CHEW: 40.0000 mg | CHEWABLE_TABLET | Freq: Four times a day (QID) | ORAL | Status: DC | PRN

## 2019-03-18 MED ORDER — LIDOCAINE-EPINEPHRINE (PF) 1 %-1:200000 IJ SOLN
INTRAMUSCULAR | Status: AC
  Filled 2019-03-18: qty 30

## 2019-03-18 MED ORDER — PIPERACILLIN-TAZOBACTAM 3.375 G IVPB
INTRAVENOUS | Status: AC
  Filled 2019-03-18: qty 50

## 2019-03-18 MED ORDER — TRAMADOL HCL 50 MG PO TABS: 50.0000 mg | ORAL_TABLET | Freq: Four times a day (QID) | ORAL | Status: DC | PRN

## 2019-03-18 MED ORDER — OXYCODONE HCL 5 MG/5ML PO SOLN: 5.0000 mg | Freq: Once | ORAL | Status: DC | PRN

## 2019-03-18 MED ORDER — DEXAMETHASONE SODIUM PHOSPHATE 10 MG/ML IJ SOLN
INTRAMUSCULAR | Status: AC
  Filled 2019-03-18: qty 1

## 2019-03-18 MED ORDER — ENOXAPARIN SODIUM 40 MG/0.4ML ~~LOC~~ SOLN
40.0000 mg | Freq: Every day | SUBCUTANEOUS | Status: DC
  Administered 2019-03-19: 40 mg via SUBCUTANEOUS
  Filled 2019-03-18: qty 0.4

## 2019-03-18 MED ORDER — ONDANSETRON 4 MG PO TBDP: 4.0000 mg | ORAL_TABLET | Freq: Four times a day (QID) | ORAL | Status: DC | PRN

## 2019-03-18 MED ORDER — LIDOCAINE 2% (20 MG/ML) 5 ML SYRINGE
INTRAMUSCULAR | Status: AC
  Filled 2019-03-18: qty 5

## 2019-03-18 MED ORDER — PRAVASTATIN SODIUM 20 MG PO TABS: 40.0000 mg | ORAL_TABLET | Freq: Every day | ORAL | Status: DC

## 2019-03-18 MED ORDER — DIPHENHYDRAMINE HCL 12.5 MG/5ML PO ELIX: 12.5000 mg | ORAL_SOLUTION | Freq: Four times a day (QID) | ORAL | Status: DC | PRN

## 2019-03-18 MED ORDER — OXYBUTYNIN CHLORIDE ER 5 MG PO TB24
5.0000 mg | ORAL_TABLET | Freq: Every day | ORAL | Status: DC
  Administered 2019-03-18: 5 mg via ORAL
  Filled 2019-03-18: qty 1

## 2019-03-18 MED ORDER — ONDANSETRON HCL 4 MG/2ML IJ SOLN
INTRAMUSCULAR | Status: AC
  Filled 2019-03-18: qty 2

## 2019-03-18 MED ORDER — SENNA 8.6 MG PO TABS
1.0000 | ORAL_TABLET | Freq: Two times a day (BID) | ORAL | Status: DC
  Administered 2019-03-18 – 2019-03-19 (×2): 8.6 mg via ORAL
  Filled 2019-03-18 (×2): qty 1

## 2019-03-18 MED ORDER — ACETAMINOPHEN 650 MG RE SUPP: 650.0000 mg | Freq: Four times a day (QID) | RECTAL | Status: DC | PRN

## 2019-03-18 MED ORDER — ALBUTEROL SULFATE (2.5 MG/3ML) 0.083% IN NEBU: 2.5000 mg | INHALATION_SOLUTION | Freq: Four times a day (QID) | RESPIRATORY_TRACT | Status: DC | PRN

## 2019-03-18 MED ORDER — ALBUTEROL SULFATE HFA 108 (90 BASE) MCG/ACT IN AERS: 1.0000 | INHALATION_SPRAY | Freq: Four times a day (QID) | RESPIRATORY_TRACT | Status: DC | PRN

## 2019-03-18 MED ORDER — SODIUM CHLORIDE 0.9 % IV SOLN
2.0000 g | Freq: Once | INTRAVENOUS | Status: AC
  Administered 2019-03-18: 2 g via INTRAVENOUS
  Filled 2019-03-18: qty 20

## 2019-03-18 MED ORDER — MOMETASONE FURO-FORMOTEROL FUM 200-5 MCG/ACT IN AERO
2.0000 | INHALATION_SPRAY | Freq: Two times a day (BID) | RESPIRATORY_TRACT | Status: DC
  Administered 2019-03-19: 2 via RESPIRATORY_TRACT
  Filled 2019-03-18: qty 8.8

## 2019-03-18 MED ORDER — MORPHINE SULFATE (PF) 2 MG/ML IV SOLN
1.0000 mg | INTRAVENOUS | Status: DC | PRN
  Administered 2019-03-19: 2 mg via INTRAVENOUS
  Filled 2019-03-18: qty 1

## 2019-03-18 MED ORDER — SUCCINYLCHOLINE CHLORIDE 200 MG/10ML IV SOSY
PREFILLED_SYRINGE | INTRAVENOUS | Status: DC | PRN
  Administered 2019-03-18: 140 mg via INTRAVENOUS

## 2019-03-18 MED ORDER — ZOLPIDEM TARTRATE 5 MG PO TABS: 5.0000 mg | ORAL_TABLET | Freq: Every evening | ORAL | Status: DC | PRN

## 2019-03-18 MED ORDER — METHOCARBAMOL 500 MG PO TABS: 500.0000 mg | ORAL_TABLET | Freq: Four times a day (QID) | ORAL | Status: DC | PRN

## 2019-03-18 MED ORDER — SODIUM CHLORIDE 0.9 % IR SOLN
Status: DC | PRN
  Administered 2019-03-18: 1000 mL

## 2019-03-18 MED ORDER — MIDAZOLAM HCL 5 MG/5ML IJ SOLN
INTRAMUSCULAR | Status: DC | PRN
  Administered 2019-03-18: 2 mg via INTRAVENOUS

## 2019-03-18 MED ORDER — PANTOPRAZOLE SODIUM 40 MG PO TBEC
40.0000 mg | DELAYED_RELEASE_TABLET | Freq: Two times a day (BID) | ORAL | Status: DC
  Administered 2019-03-19: 08:00:00 40 mg via ORAL
  Filled 2019-03-18: qty 1

## 2019-03-18 MED ORDER — PHENYLEPHRINE 40 MCG/ML (10ML) SYRINGE FOR IV PUSH (FOR BLOOD PRESSURE SUPPORT)
PREFILLED_SYRINGE | INTRAVENOUS | Status: AC
  Filled 2019-03-18: qty 10

## 2019-03-18 MED ORDER — ROCURONIUM BROMIDE 10 MG/ML (PF) SYRINGE
PREFILLED_SYRINGE | INTRAVENOUS | Status: DC | PRN
  Administered 2019-03-18: 30 mg via INTRAVENOUS
  Administered 2019-03-18: 10 mg via INTRAVENOUS

## 2019-03-18 MED ORDER — ONDANSETRON HCL 4 MG/2ML IJ SOLN: 4.0000 mg | Freq: Four times a day (QID) | INTRAMUSCULAR | Status: DC | PRN

## 2019-03-18 MED ORDER — FENTANYL CITRATE (PF) 100 MCG/2ML IJ SOLN
INTRAMUSCULAR | Status: DC | PRN
  Administered 2019-03-18 (×2): 50 ug via INTRAVENOUS

## 2019-03-18 MED ORDER — ONDANSETRON HCL 4 MG/2ML IJ SOLN: 4.0000 mg | Freq: Once | INTRAMUSCULAR | Status: DC | PRN

## 2019-03-18 MED ORDER — OXYCODONE HCL 5 MG PO TABS
5.0000 mg | ORAL_TABLET | ORAL | Status: DC | PRN
  Administered 2019-03-19: 06:00:00 5 mg via ORAL
  Filled 2019-03-18: qty 1

## 2019-03-18 MED ORDER — METRONIDAZOLE IN NACL 5-0.79 MG/ML-% IV SOLN
500.0000 mg | Freq: Once | INTRAVENOUS | Status: AC
  Administered 2019-03-18: 500 mg via INTRAVENOUS
  Filled 2019-03-18: qty 100

## 2019-03-18 MED ORDER — ROCURONIUM BROMIDE 10 MG/ML (PF) SYRINGE
PREFILLED_SYRINGE | INTRAVENOUS | Status: AC
  Filled 2019-03-18: qty 10

## 2019-03-18 MED ORDER — OXYCODONE HCL 5 MG PO TABS: 5.0000 mg | ORAL_TABLET | Freq: Once | ORAL | Status: DC | PRN

## 2019-03-18 MED ORDER — SUCCINYLCHOLINE CHLORIDE 200 MG/10ML IV SOSY
PREFILLED_SYRINGE | INTRAVENOUS | Status: AC
  Filled 2019-03-18: qty 10

## 2019-03-18 MED ORDER — FENTANYL CITRATE (PF) 100 MCG/2ML IJ SOLN
INTRAMUSCULAR | Status: AC
  Filled 2019-03-18: qty 2

## 2019-03-18 MED ORDER — MIDAZOLAM HCL 2 MG/2ML IJ SOLN
INTRAMUSCULAR | Status: AC
  Filled 2019-03-18: qty 2

## 2019-03-18 MED ORDER — BUPIVACAINE HCL (PF) 0.25 % IJ SOLN
INTRAMUSCULAR | Status: DC | PRN
  Administered 2019-03-18: 30 mL

## 2019-03-18 MED ORDER — SODIUM CHLORIDE 0.9 % IV SOLN
INTRAVENOUS | Status: DC | PRN
  Administered 2019-03-18: 1000 mL via INTRAVENOUS
  Administered 2019-03-18: 500 mL via INTRAVENOUS

## 2019-03-18 MED ORDER — LOSARTAN POTASSIUM 25 MG PO TABS
25.0000 mg | ORAL_TABLET | Freq: Every day | ORAL | Status: DC
  Administered 2019-03-19: 25 mg via ORAL
  Filled 2019-03-18: qty 1

## 2019-03-18 MED ORDER — DEXTROSE IN LACTATED RINGERS 5 % IV SOLN
INTRAVENOUS | Status: DC
  Administered 2019-03-18: 22:00:00 via INTRAVENOUS

## 2019-03-18 MED ORDER — HYDRALAZINE HCL 20 MG/ML IJ SOLN: 10.0000 mg | INTRAMUSCULAR | Status: DC | PRN

## 2019-03-18 MED ORDER — PHENYLEPHRINE 40 MCG/ML (10ML) SYRINGE FOR IV PUSH (FOR BLOOD PRESSURE SUPPORT)
PREFILLED_SYRINGE | INTRAVENOUS | Status: DC | PRN
  Administered 2019-03-18 (×2): 80 ug via INTRAVENOUS

## 2019-03-18 MED ORDER — ACETAMINOPHEN 325 MG PO TABS: 650.0000 mg | ORAL_TABLET | Freq: Four times a day (QID) | ORAL | Status: DC | PRN

## 2019-03-18 MED ORDER — GABAPENTIN 100 MG PO CAPS
100.0000 mg | ORAL_CAPSULE | Freq: Two times a day (BID) | ORAL | Status: DC
  Administered 2019-03-18 – 2019-03-19 (×2): 100 mg via ORAL
  Filled 2019-03-18 (×2): qty 1

## 2019-03-18 MED ORDER — FENTANYL CITRATE (PF) 100 MCG/2ML IJ SOLN: 25.0000 ug | INTRAMUSCULAR | Status: DC | PRN

## 2019-03-18 MED ORDER — PIPERACILLIN-TAZOBACTAM 3.375 G IVPB: 3.3750 g | INTRAVENOUS | Status: DC

## 2019-03-18 MED ORDER — LACTATED RINGERS IR SOLN
Status: DC | PRN
  Administered 2019-03-18: 1000 mL

## 2019-03-18 MED ORDER — BUPIVACAINE HCL (PF) 0.25 % IJ SOLN
INTRAMUSCULAR | Status: AC
  Filled 2019-03-18: qty 30

## 2019-03-18 MED ORDER — DEXAMETHASONE SODIUM PHOSPHATE 10 MG/ML IJ SOLN
INTRAMUSCULAR | Status: DC | PRN
  Administered 2019-03-18: 10 mg via INTRAVENOUS

## 2019-03-18 MED ORDER — SODIUM CHLORIDE (PF) 0.9 % IJ SOLN
INTRAMUSCULAR | Status: DC | PRN
  Administered 2019-03-18: 30 mL via INTRAMUSCULAR

## 2019-03-18 MED ORDER — ONDANSETRON HCL 4 MG/2ML IJ SOLN
INTRAMUSCULAR | Status: DC | PRN
  Administered 2019-03-18: 4 mg via INTRAVENOUS

## 2019-03-18 MED ORDER — LIDOCAINE 2% (20 MG/ML) 5 ML SYRINGE
INTRAMUSCULAR | Status: DC | PRN
  Administered 2019-03-18: 100 mg via INTRAVENOUS

## 2019-03-18 MED ORDER — BUPROPION HCL ER (XL) 150 MG PO TB24
150.0000 mg | ORAL_TABLET | Freq: Two times a day (BID) | ORAL | Status: DC
  Administered 2019-03-18 – 2019-03-19 (×2): 150 mg via ORAL
  Filled 2019-03-18 (×2): qty 1

## 2019-03-18 MED ORDER — DIPHENHYDRAMINE HCL 50 MG/ML IJ SOLN: 12.5000 mg | Freq: Four times a day (QID) | INTRAMUSCULAR | Status: DC | PRN

## 2019-03-18 MED ORDER — PIPERACILLIN-TAZOBACTAM 3.375 G IVPB
3.3750 g | Freq: Three times a day (TID) | INTRAVENOUS | Status: DC
  Administered 2019-03-19: 01:00:00 3.375 g via INTRAVENOUS
  Filled 2019-03-18: qty 50

## 2019-03-18 MED ORDER — PROPOFOL 10 MG/ML IV BOLUS
INTRAVENOUS | Status: DC | PRN
  Administered 2019-03-18: 180 mg via INTRAVENOUS

## 2019-03-18 SURGICAL SUPPLY — 34 items
APPLIER CLIP ROT 10 11.4 M/L (STAPLE)
CABLE HIGH FREQUENCY MONO STRZ (ELECTRODE) ×3 IMPLANT
CLIP APPLIE ROT 10 11.4 M/L (STAPLE) IMPLANT
COVER SURGICAL LIGHT HANDLE (MISCELLANEOUS) ×3 IMPLANT
COVER WAND RF STERILE (DRAPES) IMPLANT
CUTTER FLEX LINEAR 45M (STAPLE) ×3 IMPLANT
DECANTER SPIKE VIAL GLASS SM (MISCELLANEOUS) ×3 IMPLANT
DERMABOND ADVANCED (GAUZE/BANDAGES/DRESSINGS) ×2
DERMABOND ADVANCED .7 DNX12 (GAUZE/BANDAGES/DRESSINGS) ×1 IMPLANT
DRAPE LAPAROSCOPIC ABDOMINAL (DRAPES) ×3 IMPLANT
ELECT REM PT RETURN 15FT ADLT (MISCELLANEOUS) ×3 IMPLANT
ENDOLOOP SUT PDS II  0 18 (SUTURE)
ENDOLOOP SUT PDS II 0 18 (SUTURE) IMPLANT
GLOVE BIO SURGEON STRL SZ 6 (GLOVE) ×3 IMPLANT
GLOVE INDICATOR 6.5 STRL GRN (GLOVE) ×3 IMPLANT
GOWN STRL REUS W/TWL 2XL LVL3 (GOWN DISPOSABLE) ×3 IMPLANT
GOWN STRL REUS W/TWL XL LVL3 (GOWN DISPOSABLE) ×3 IMPLANT
KIT BASIN OR (CUSTOM PROCEDURE TRAY) ×3 IMPLANT
KIT TURNOVER KIT A (KITS) IMPLANT
POUCH SPECIMEN RETRIEVAL 10MM (ENDOMECHANICALS) ×6 IMPLANT
RELOAD 45 VASCULAR/THIN (ENDOMECHANICALS) IMPLANT
RELOAD STAPLE TA45 3.5 REG BLU (ENDOMECHANICALS) ×3 IMPLANT
SCISSORS LAP 5X35 DISP (ENDOMECHANICALS) ×3 IMPLANT
SET IRRIG TUBING LAPAROSCOPIC (IRRIGATION / IRRIGATOR) ×3 IMPLANT
SET TUBE SMOKE EVAC HIGH FLOW (TUBING) ×3 IMPLANT
SHEARS HARMONIC ACE PLUS 36CM (ENDOMECHANICALS) ×3 IMPLANT
SLEEVE XCEL OPT CAN 5 100 (ENDOMECHANICALS) ×3 IMPLANT
SUT MNCRL AB 4-0 PS2 18 (SUTURE) ×3 IMPLANT
SUT VICRYL 0 UR6 27IN ABS (SUTURE) ×3 IMPLANT
TOWEL OR 17X26 10 PK STRL BLUE (TOWEL DISPOSABLE) ×3 IMPLANT
TRAY FOLEY MTR SLVR 16FR STAT (SET/KITS/TRAYS/PACK) ×3 IMPLANT
TRAY LAPAROSCOPIC (CUSTOM PROCEDURE TRAY) ×3 IMPLANT
TROCAR BLADELESS OPT 5 100 (ENDOMECHANICALS) ×3 IMPLANT
TROCAR XCEL BLUNT TIP 100MML (ENDOMECHANICALS) ×3 IMPLANT

## 2019-03-18 NOTE — ED Notes (Signed)
Pt aware we need urine specimen, urinal provided 

## 2019-03-18 NOTE — ED Triage Notes (Addendum)
Hx of kidney stones. Pt has a stone with him he passed last week. C/o right flank pain since early am and states he has not voided since he went to bed last night

## 2019-03-18 NOTE — Anesthesia Preprocedure Evaluation (Signed)
Anesthesia Evaluation  Patient identified by MRN, date of birth, ID band Patient awake    Reviewed: Allergy & Precautions, NPO status , Patient's Chart, lab work & pertinent test results  Airway Mallampati: II  TM Distance: >3 FB Neck ROM: Full    Dental  (+) Edentulous Upper, Edentulous Lower   Pulmonary    breath sounds clear to auscultation       Cardiovascular hypertension,  Rhythm:Regular Rate:Normal     Neuro/Psych    GI/Hepatic   Endo/Other    Renal/GU      Musculoskeletal   Abdominal   Peds  Hematology   Anesthesia Other Findings   Reproductive/Obstetrics                             Anesthesia Physical Anesthesia Plan  ASA: III and emergent  Anesthesia Plan: General   Post-op Pain Management:    Induction: Intravenous, Rapid sequence and Cricoid pressure planned  PONV Risk Score and Plan: Ondansetron and Dexamethasone  Airway Management Planned: Oral ETT  Additional Equipment:   Intra-op Plan:   Post-operative Plan: Extubation in OR  Informed Consent: I have reviewed the patients History and Physical, chart, labs and discussed the procedure including the risks, benefits and alternatives for the proposed anesthesia with the patient or authorized representative who has indicated his/her understanding and acceptance.       Plan Discussed with: Anesthesiologist and CRNA  Anesthesia Plan Comments:         Anesthesia Quick Evaluation

## 2019-03-18 NOTE — H&P (Signed)
Dustin Randall is an 58 y.o. male.   Chief Complaint: abdominal pain HPI:  Pt is a 58 yo M who presents with around 24 hours of lower abdominal pain.  He thought it was another kidney stone as he has had those before as recently as last week.  The pain was more pervasive and stayed longer and "the wife made him come in."  He had some dry heaves but no real emesis.  He did not have any fevers.  The pain did settle out more in the RLQ/flank.  He has never had pain like this before.    Past Medical History:  Diagnosis Date  . Depression   . Hypertension   . Kidney stones   . Pneumonia   . Stroke [facility](HCC)     Past Surgical History:  Procedure Laterality Date  . CYSTOSCOPY    . HERNIA REPAIR    . hip replacements    . JOINT REPLACEMENT    . KNEE SURGERY    . LITHOTRIPSY      Family History  Problem Relation Age of Onset  . Heart disease Father    Social History:  reports that he has never smoked. He has never used smokeless tobacco. He reports that he does not drink alcohol or use drugs.  Allergies: No Known Allergies  Medications Prior to Admission  Medication Sig Dispense Refill  . oxybutynin (DITROPAN-XL) 5 MG 24 hr tablet Take by mouth.    Marland Kitchen. albuterol (PROVENTIL HFA;VENTOLIN HFA) 108 (90 BASE) MCG/ACT inhaler Inhale 1 puff into the lungs every 6 (six) hours as needed for wheezing or shortness of breath.    Marland Kitchen. albuterol (PROVENTIL) (2.5 MG/3ML) 0.083% nebulizer solution Take 2.5 mg by nebulization every 6 (six) hours as needed for wheezing or shortness of breath.    . Ascorbic Acid (VITAMIN C) 1000 MG tablet Take 1,000 mg by mouth daily.    . budesonide-formoterol (SYMBICORT) 160-4.5 MCG/ACT inhaler Inhale 2 puffs into the lungs 2 (two) times daily. 1 Inhaler 11  . buPROPion (WELLBUTRIN XL) 150 MG 24 hr tablet Take 150 mg by mouth 2 (two) times daily.    . chlorpheniramine-HYDROcodone (TUSSIONEX PENNKINETIC ER) 10-8 MG/5ML SUER Take 5 mLs by mouth every 12 (twelve) hours as needed  for cough. 140 mL 0  . cholecalciferol (VITAMIN D) 1000 UNITS tablet Take 1,000 Units by mouth daily.    Marland Kitchen. losartan (COZAAR) 25 MG tablet Take 25 mg by mouth daily.    . Multiple Vitamin (MULTIVITAMIN WITH MINERALS) TABS tablet Take 1 tablet by mouth daily.    Marland Kitchen. omeprazole (PRILOSEC OTC) 20 MG tablet Take 20 mg by mouth 2 (two) times daily before a meal.    . pravastatin (PRAVACHOL) 40 MG tablet Take 40 mg by mouth daily.      Results for orders placed or performed during the hospital encounter of 03/18/19 (from the past 48 hour(s))  CBC with Differential/Platelet     Status: Abnormal   Collection Time: 03/18/19  2:11 PM  Result Value Ref Range   WBC 14.5 (H) 4.0 - 10.5 K/uL   RBC 5.18 4.22 - 5.81 MIL/uL   Hemoglobin 15.5 13.0 - 17.0 g/dL   HCT 40.948.2 81.139.0 - 91.452.0 %   MCV 93.1 80.0 - 100.0 fL   MCH 29.9 26.0 - 34.0 pg   MCHC 32.2 30.0 - 36.0 g/dL   RDW 78.213.0 95.611.5 - 21.315.5 %   Platelets 209 150 - 400 K/uL   nRBC 0.0  0.0 - 0.2 %   Neutrophils Relative % 86 %   Neutro Abs 12.4 (H) 1.7 - 7.7 K/uL   Lymphocytes Relative 5 %   Lymphs Abs 0.8 0.7 - 4.0 K/uL   Monocytes Relative 8 %   Monocytes Absolute 1.1 (H) 0.1 - 1.0 K/uL   Eosinophils Relative 1 %   Eosinophils Absolute 0.1 0.0 - 0.5 K/uL   Basophils Relative 0 %   Basophils Absolute 0.0 0.0 - 0.1 K/uL   Immature Granulocytes 0 %   Abs Immature Granulocytes 0.06 0.00 - 0.07 K/uL    Comment: Performed at [facility] [place], [address] [place], [place] [address]  Comprehensive metabolic panel     Status: Abnormal   Collection Time: 03/18/19  2:11 PM  Result Value Ref Range   Sodium 138 135 - 145 mmol/L   Potassium 4.2 3.5 - 5.1 mmol/L   Chloride 103 98 - 111 mmol/L   CO2 24 22 - 32 mmol/L   Glucose, Bld 113 (H) 70 - 99 mg/dL   BUN 22 (H) 6 - 20 mg/dL   Creatinine, Ser 4.091.11 0.61 - 1.24 mg/dL   Calcium 9.1 8.9 - 81.110.3 mg/dL   Total Protein 7.5 6.5 - 8.1 g/dL   Albumin 4.1 3.5 - 5.0 g/dL   AST 19 15 - 41 U/L   ALT 21 0 -  44 U/L   Alkaline Phosphatase 70 38 - 126 U/L   Total Bilirubin 0.8 0.3 - 1.2 mg/dL   GFR calc non Af Amer >60 >60 mL/min   GFR calc Af Amer >60 >60 mL/min   Anion gap 11 5 - 15    Comment: Performed at [facility] [place], [address] [place], [place] [address]  Urinalysis, Routine w reflex microscopic     Status: Abnormal   Collection Time: 03/18/19  2:48 PM  Result Value Ref Range   Color, Urine YELLOW YELLOW   APPearance CLEAR CLEAR   Specific Gravity, Urine >1.030 (H) 1.005 - 1.030   pH 5.5 5.0 - 8.0   Glucose, UA NEGATIVE NEGATIVE mg/dL   Hgb urine dipstick NEGATIVE NEGATIVE   Bilirubin Urine NEGATIVE NEGATIVE   Ketones, ur 15 (A) NEGATIVE mg/dL   Protein, ur NEGATIVE NEGATIVE mg/dL   Nitrite NEGATIVE NEGATIVE   Leukocytes,Ua NEGATIVE NEGATIVE    Comment: Microscopic not done on urines with negative protein, blood, leukocytes, nitrite, or glucose < 500 mg/dL. Performed at [facility] [place], [address] [place], [place] [address]   SARS Coronavirus 2 [facility](Hospital order, Performed in [facility] hospital lab) Nasopharyngeal Nasopharyngeal Swab     Status: None   Collection Time: 03/18/19  4:17 PM   Specimen: Nasopharyngeal Swab  Result Value Ref Range   SARS Coronavirus 2 NEGATIVE NEGATIVE    Comment: (NOTE) If result is NEGATIVE SARS-CoV-2 target nucleic acids are NOT DETECTED. The SARS-CoV-2 RNA is generally detectable in upper and lower  respiratory specimens during the acute phase of infection. The lowest  concentration of SARS-CoV-2 viral copies this assay can detect is 250  copies / mL. A negative result does not preclude SARS-CoV-2 infection  and should not be used as the sole basis for treatment or other  patient management decisions.  A negative result may occur with  improper specimen collection / handling, submission of specimen other  than nasopharyngeal swab, presence of viral mutation(s) within the  areas targeted by this assay, and inadequate  number of  viral copies  (<250 copies / mL). A negative result must be combined with clinical  observations, patient history, and epidemiological information. If result is POSITIVE SARS-CoV-2 target nucleic acids are DETECTED. The SARS-CoV-2 RNA is generally detectable in upper and lower  respiratory specimens dur ing the acute phase of infection.  Positive  results are indicative of active infection with SARS-CoV-2.  Clinical  correlation with patient history and other diagnostic information is  necessary to determine patient infection status.  Positive results do  not rule out bacterial infection or co-infection with other viruses. If result is PRESUMPTIVE POSTIVE SARS-CoV-2 nucleic acids MAY BE PRESENT.   A presumptive positive result was obtained on the submitted specimen  and confirmed on repeat testing.  While 2019 novel coronavirus  (SARS-CoV-2) nucleic acids may be present in the submitted sample  additional confirmatory testing may be necessary for epidemiological  and / or clinical management purposes  to differentiate between  SARS-CoV-2 and other Sarbecovirus currently known to infect humans.  If clinically indicated additional testing with an alternate test  methodology [phone] is advised. The SARS-CoV-2 RNA is generally  detectable in upper and lower respiratory sp ecimens during the acute  phase of infection. The expected result is Negative. Fact Sheet for Patients:  StrictlyIdeas.no Fact Sheet for Healthcare Providers: BankingDealers.co.za This test is not yet approved or cleared by the [place] FDA and has been authorized for detection and/or diagnosis of SARS-CoV-2 by FDA under an Emergency Use Authorization (EUA).  This EUA will remain in effect (meaning this test can be used) for the duration of the COVID-19 declaration under Section 564(b)(1) of the Act, 21 U.S.C. section 360bbb-3(b)(1), unless the  authorization is terminated or revoked sooner. Performed at [facility], [address] [place], [place] [address]    Ct Renal Joaquim Lai Study  Result Date: 03/18/2019 CLINICAL DATA:  Flank pain. EXAM: CT ABDOMEN AND PELVIS WITHOUT CONTRAST TECHNIQUE: Multidetector CT imaging of the abdomen and pelvis was performed following the standard protocol without IV contrast. COMPARISON:  CT abdomen dated 07/05/2011. FINDINGS: Lower chest: No acute abnormality. Hepatobiliary: No focal liver abnormality is seen. No gallstones, gallbladder wall thickening, or biliary dilatation. Pancreas: Partially infiltrated with fat but otherwise unremarkable. Spleen: Normal in size without focal abnormality. Adrenals/Urinary Tract: Adrenal glands appear normal. 1 mm nonobstructing RIGHT renal stone. LEFT kidney appears normal without stone or hydronephrosis. No perinephric fluid. No ureteral calculi identified, although distal ureters are partially obscured by metallic artifact emanating from patient's bilateral hip prostheses. Stomach/Bowel: Fairly extensive diverticulosis of the sigmoid and descending colon but no focal inflammatory change to suggest acute diverticulitis. Appendix is distended to approximately 10 mm, containing a 10 mm appendicolith. Subtle inflammatory stranding near the base of the appendix compatible with acute appendicitis. No dilated large or small bowel loops. Vascular/Lymphatic: No significant vascular findings are present. No enlarged abdominal or pelvic lymph nodes. Reproductive: Prostate is obscured by artifact emanating from patient's bilateral hip prostheses. Other: No free fluid or abscess collection identified. No free intraperitoneal air. Musculoskeletal: No acute or suspicious osseous finding. 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 IMPRESSION: 1. Acute mild/early appendicitis, as detailed above. No associated abscess collection or free intraperitoneal air. 2. Colonic diverticulosis without  evidence of acute diverticulitis. 3. 1 mm nonobstructing RIGHT renal stone.  No hydronephrosis. 4. Additional chronic/incidental findings detailed above. Electronically Signed   By: Franki Cabot M.D.   On: 03/18/2019 15:32    Review of Systems  Gastrointestinal: Positive for  abdominal pain and nausea.  All other systems reviewed and are negative.   Blood pressure 123/86, pulse 75, temperature 98.5 F (36.9 C), temperature source Oral, resp. rate 18, height 5\' 9"  (1.753 m), weight 94.3 kg, SpO2 98 %. Physical Exam  Constitutional: He is oriented to person, place, and time. He appears well-developed and well-nourished. No distress.  HENT:  Head: Normocephalic and atraumatic.  Right Ear: External ear normal.  Left Ear: External ear normal.  Eyes: Pupils are equal, round, and reactive to light. Conjunctivae are normal. No scleral icterus.  Neck: Neck supple. No tracheal deviation present. No thyromegaly present.  Cardiovascular: Normal rate and intact distal pulses.  No murmur heard. Respiratory: Effort normal and breath sounds normal. No respiratory distress. He has no wheezes. He exhibits no tenderness.  GI: Soft. Bowel sounds are normal. He exhibits no distension. There is abdominal tenderness (RLQ). There is no rebound and no guarding.  Musculoskeletal: Normal range of motion.        General: No tenderness, deformity or edema.  Lymphadenopathy:    He has no cervical adenopathy.  Neurological: He is alert and oriented to person, place, and time. Coordination normal.  Skin: Skin is warm and dry. No rash noted. He is not diaphoretic. No erythema. No pallor.  Psychiatric: He has a normal mood and affect. His behavior is normal. Judgment and thought content normal.     Assessment/Plan Acute appendicitis  NPO IV Fluids IV antibiotics  To OR for laparoscopic appendectomy.  Appendectomy was described to the patient.  The incisions and surgical technique were explained.  The patient  was advised that some of the hair on the abdomen would be clipped, and that a foley catheter would be placed.  I advised the patient of the risks of surgery including, but not limited to, bleeding, infection, damage to other structures, risk of an open operation, risk of abscess, and risk of blood clot.  The recovery was also described to the patient.  He was advised that he will have lifting restrictions for 2 weeks.     Almond Lint, MD 03/18/2019, 6:56 PM

## 2019-03-18 NOTE — ED Notes (Signed)
Bladder scan : 91 mls

## 2019-03-18 NOTE — Anesthesia Procedure Notes (Signed)
Procedure Name: Intubation Date/Time: 03/18/2019 7:17 PM Performed by: Muaaz Brau D, CRNA Pre-anesthesia Checklist: Patient identified, Emergency Drugs available, Suction available and Patient being monitored Patient Re-evaluated:Patient Re-evaluated prior to induction Oxygen Delivery Method: Circle system utilized Preoxygenation: Pre-oxygenation with 100% oxygen Induction Type: IV induction, Rapid sequence and Cricoid Pressure applied Laryngoscope Size: Mac and 4 Grade View: Grade I Tube type: Oral Tube size: 7.5 mm Number of attempts: 1 Airway Equipment and Method: Stylet and Oral airway Placement Confirmation: ETT inserted through vocal cords under direct vision,  positive ETCO2 and breath sounds checked- equal and bilateral Secured at: 22 cm Tube secured with: Tape Dental Injury: Teeth and Oropharynx as per pre-operative assessment

## 2019-03-18 NOTE — ED Notes (Signed)
ED TO INPATIENT HANDOFF REPORT ED Nurse Name and Phone #: Jannifer HickJerri, RN [phone]  S Name/Age/Gender Dustin Randall 58 y.o. male Room/Bed: MH10/MH10  Code Status   Code Status: Prior  Home/SNF/Other Home Patient oriented to: self, place, time and situation Is this baseline? Yes   Triage Complete: Triage complete  Chief Complaint flank pain  Triage Note Hx of kidney stones. Pt has a stone with him he passed last week. C/o right flank pain since early am and states he has not voided since he went to bed last night   Allergies No Known Allergies  Level of Care/Admitting Diagnosis ED Disposition    ED Disposition Condition Comment   Admit  The patient appears reasonably stabilized for admission considering the current resources, flow, and capabilities available in the ED at this time, and I doubt any other [facility] requiring further screening and/or treatment in the ED prior to admission is  present.       B Medical/Surgery History Past Medical History:  Diagnosis Date  . Depression   . Hypertension   . Kidney stones   . Pneumonia   . Stroke [facility](HCC)    Past Surgical History:  Procedure Laterality Date  . CYSTOSCOPY    . HERNIA REPAIR    . hip replacements    . JOINT REPLACEMENT    . KNEE SURGERY    . LITHOTRIPSY       A IV Location/Drains/Wounds Patient Lines/Drains/Airways Status   Active Line/Drains/Airways    Name:   Placement date:   Placement time:   Site:   Days:   Peripheral IV 03/18/19 Right Antecubital   03/18/19    1602    Antecubital   less than 1          Intake/Output Last 24 hours  Intake/Output Summary (Last 24 hours) at 03/18/2019 1734 Last data filed at 03/18/2019 1718 Gross per 24 hour  Intake 100 ml  Output -  Net 100 ml    Labs/Imaging Results for orders placed or performed during the hospital encounter of 03/18/19 (from the past 48 hour(s))  CBC with Differential/Platelet     Status: Abnormal   Collection Time: 03/18/19  2:11 PM   Result Value Ref Range   WBC 14.5 (H) 4.0 - 10.5 K/uL   RBC 5.18 4.22 - 5.81 MIL/uL   Hemoglobin 15.5 13.0 - 17.0 g/dL   HCT 81.148.2 91.439.0 - 78.252.0 %   MCV 93.1 80.0 - 100.0 fL   MCH 29.9 26.0 - 34.0 pg   MCHC 32.2 30.0 - 36.0 g/dL   RDW 95.613.0 21.311.5 - 08.615.5 %   Platelets 209 150 - 400 K/uL   nRBC 0.0 0.0 - 0.2 %   Neutrophils Relative % 86 %   Neutro Abs 12.4 (H) 1.7 - 7.7 K/uL   Lymphocytes Relative 5 %   Lymphs Abs 0.8 0.7 - 4.0 K/uL   Monocytes Relative 8 %   Monocytes Absolute 1.1 (H) 0.1 - 1.0 K/uL   Eosinophils Relative 1 %   Eosinophils Absolute 0.1 0.0 - 0.5 K/uL   Basophils Relative 0 %   Basophils Absolute 0.0 0.0 - 0.1 K/uL   Immature Granulocytes 0 %   Abs Immature Granulocytes 0.06 0.00 - 0.07 K/uL    Comment: Performed at [facility] [place], [address] [place], [place] [address]  Comprehensive metabolic panel     Status: Abnormal   Collection Time: 03/18/19  2:11 PM  Result Value Ref Range  Sodium 138 135 - 145 mmol/L   Potassium 4.2 3.5 - 5.1 mmol/L   Chloride 103 98 - 111 mmol/L   CO2 24 22 - 32 mmol/L   Glucose, Bld 113 (H) 70 - 99 mg/dL   BUN 22 (H) 6 - 20 mg/dL   Creatinine, Ser 1.11 0.61 - 1.24 mg/dL   Calcium 9.1 8.9 - 10.3 mg/dL   Total Protein 7.5 6.5 - 8.1 g/dL   Albumin 4.1 3.5 - 5.0 g/dL   AST 19 15 - 41 U/L   ALT 21 0 - 44 U/L   Alkaline Phosphatase 70 38 - 126 U/L   Total Bilirubin 0.8 0.3 - 1.2 mg/dL   GFR calc non Af Amer >60 >60 mL/min   GFR calc Af Amer >60 >60 mL/min   Anion gap 11 5 - 15    Comment: Performed at [facility], [address] [place], [place] [address]  Urinalysis, Routine w reflex microscopic     Status: Abnormal   Collection Time: 03/18/19  2:48 PM  Result Value Ref Range   Color, Urine YELLOW YELLOW   APPearance CLEAR CLEAR   Specific Gravity, Urine >1.030 (H) 1.005 - 1.030   pH 5.5 5.0 - 8.0   Glucose, UA NEGATIVE NEGATIVE mg/dL   Hgb urine dipstick NEGATIVE NEGATIVE   Bilirubin Urine NEGATIVE  NEGATIVE   Ketones, ur 15 (A) NEGATIVE mg/dL   Protein, ur NEGATIVE NEGATIVE mg/dL   Nitrite NEGATIVE NEGATIVE   Leukocytes,Ua NEGATIVE NEGATIVE    Comment: Microscopic not done on urines with negative protein, blood, leukocytes, nitrite, or glucose < 500 mg/dL. Performed at [facility], [address] [place], [place] [address]   SARS Coronavirus 2 [facility] order, Performed in [facility] hospital lab) Nasopharyngeal Nasopharyngeal Swab     Status: None   Collection Time: 03/18/19  4:17 PM   Specimen: Nasopharyngeal Swab  Result Value Ref Range   SARS Coronavirus 2 NEGATIVE NEGATIVE    Comment: (NOTE) If result is NEGATIVE SARS-CoV-2 target nucleic acids are NOT DETECTED. The SARS-CoV-2 RNA is generally detectable in upper and lower  respiratory specimens during the acute phase of infection. The lowest  concentration of SARS-CoV-2 viral copies this assay can detect is 250  copies / mL. A negative result does not preclude SARS-CoV-2 infection  and should not be used as the sole basis for treatment or other  patient management decisions.  A negative result may occur with  improper specimen collection / handling, submission of specimen other  than nasopharyngeal swab, presence of viral mutation(s) within the  areas targeted by this assay, and inadequate number of viral copies  (<250 copies / mL). A negative result must be combined with clinical  observations, patient history, and epidemiological information. If result is POSITIVE SARS-CoV-2 target nucleic acids are DETECTED. The SARS-CoV-2 RNA is generally detectable in upper and lower  respiratory specimens dur ing the acute phase of infection.  Positive  results are indicative of active infection with SARS-CoV-2.  Clinical  correlation with patient history and other diagnostic information is  necessary to determine patient infection status.  Positive results do  not rule out bacterial infection or co-infection with  other viruses. If result is PRESUMPTIVE POSTIVE SARS-CoV-2 nucleic acids MAY BE PRESENT.   A presumptive positive result was obtained on the submitted specimen  and confirmed on repeat testing.  While 2019 novel coronavirus  (SARS-CoV-2) nucleic acids may be present in the submitted  sample  additional confirmatory testing may be necessary for epidemiological  and / or clinical management purposes  to differentiate between  SARS-CoV-2 and other Sarbecovirus currently known to infect humans.  If clinically indicated additional testing with an alternate test  methodology [phone] is advised. The SARS-CoV-2 RNA is generally  detectable in upper and lower respiratory sp ecimens during the acute  phase of infection. The expected result is Negative. Fact Sheet for Patients:  BoilerBrush.com.cyhttps://www.fda.gov/media/136312/download Fact Sheet for Healthcare Providers: https://pope.com/https://www.fda.gov/media/136313/download This test is not yet approved or cleared by the [place] FDA and has been authorized for detection and/or diagnosis of SARS-CoV-2 by FDA under an Emergency Use Authorization (EUA).  This EUA will remain in effect (meaning this test can be used) for the duration of the COVID-19 declaration under Section 564(b)(1) of the Act, 21 U.S.C. section 360bbb-3(b)(1), unless the authorization is terminated or revoked sooner. Performed at [facility] [place], [address] [place], [place] [address]    Ct Renal Larina BrasStone Study  Result Date: 03/18/2019 CLINICAL DATA:  Flank pain. EXAM: CT ABDOMEN AND PELVIS WITHOUT CONTRAST TECHNIQUE: Multidetector CT imaging of the abdomen and pelvis was performed following the standard protocol without IV contrast. COMPARISON:  CT abdomen dated 07/05/2011. FINDINGS: Lower chest: No acute abnormality. Hepatobiliary: No focal liver abnormality is seen. No gallstones, gallbladder wall thickening, or biliary dilatation. Pancreas: Partially infiltrated with fat but otherwise  unremarkable. Spleen: Normal in size without focal abnormality. Adrenals/Urinary Tract: Adrenal glands appear normal. 1 mm nonobstructing RIGHT renal stone. LEFT kidney appears normal without stone or hydronephrosis. No perinephric fluid. No ureteral calculi identified, although distal ureters are partially obscured by metallic artifact emanating from patient's bilateral hip prostheses. Stomach/Bowel: Fairly extensive diverticulosis of the sigmoid and descending colon but no focal inflammatory change to suggest acute diverticulitis. Appendix is distended to approximately 10 mm, containing a 10 mm appendicolith. Subtle inflammatory stranding near the base of the appendix compatible with acute appendicitis. No dilated large or small bowel loops. Vascular/Lymphatic: No significant vascular findings are present. No enlarged abdominal or pelvic lymph nodes. Reproductive: Prostate is obscured by artifact emanating from patient's bilateral hip prostheses. Other: No free fluid or abscess collection identified. No free intraperitoneal air. Musculoskeletal: No acute or suspicious osseous finding. 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 IMPRESSION: 1. Acute mild/early appendicitis, as detailed above. No associated abscess collection or free intraperitoneal air. 2. Colonic diverticulosis without evidence of acute diverticulitis. 3. 1 mm nonobstructing RIGHT renal stone.  No hydronephrosis. 4. Additional chronic/incidental findings detailed above. Electronically Signed   By: Bary RichardStan  Maynard M.D.   On: 03/18/2019 15:32    Pending Labs Unresulted Labs (From admission, onward)   None      Vitals/Pain Today's Vitals   03/18/19 1326 03/18/19 1327 03/18/19 1442 03/18/19 1540  BP: (!) 144/81   123/86  Pulse: 75   75  Resp: 18   18  Temp: 98.5 F (36.9 C)     TempSrc: Oral     SpO2:    98%  Weight:  94.3 kg    Height:  5\' 9"  (1.753 m)    PainSc:  7  5      Isolation Precautions No active  isolations  Medications Medications  cefTRIAXone (ROCEPHIN) 2 g in sodium chloride 0.9 % 100 mL IVPB (0 g Intravenous Stopped 03/18/19 1718)    And  metroNIDAZOLE (FLAGYL) IVPB 500 mg (500 mg Intravenous New Bag/Given 03/18/19 1717)  0.9 %  sodium chloride infusion (500 mLs  Intravenous New Bag/Given 03/18/19 1640)  piperacillin-tazobactam (ZOSYN) IVPB 3.375 g (has no administration in time range)  dextrose 5 % in lactated ringers infusion (has no administration in time range)    Mobility walks Low fall risk   Focused Assessments .   R Recommendations: See Admitting Provider Note  Report given to:   Additional Notes: .

## 2019-03-18 NOTE — Op Note (Signed)
Appendectomy, Lap, Procedure Note  Indications: The patient presented with a history of right-sided abdominal pain. A CT revealed findings consistent with acute appendicitis.  Pre-operative Diagnosis: Acute appendicitis  Post-operative Diagnosis: Same  Surgeon: Stark Klein   Assistants: n/a  Anesthesia: General endotracheal anesthesia and Local anesthesia 1% plain lidocaine, 0.25.% bupivacaine, with epinephrine  ASA Class: 3, e  Procedure Details  The patient was seen again in the Holding Room. The risks, benefits, complications, treatment options, and expected outcomes were discussed with the patient and/or family. The possibilities of perforation of viscus, bleeding, recurrent infection, the need for additional procedures, failure to diagnose a condition, and creating a complication requiring transfusion or operation were discussed. There was concurrence with the proposed plan and informed consent was obtained. The site of surgery was properly noted. The patient was taken to Operating Room, identified as Renata Caprice and the procedure verified as Appendectomy. A Time Out was held and the above information confirmed.  The patient was placed in the supine position and general anesthesia was induced, along with placement of orogastric tube, Venodyne boots, and a Foley catheter. The abdomen was prepped and draped in a sterile fashion. Local anesthetic was infiltrated in the infraumbilical region.  A 1.5 cm transverse curvilinear incision was made just below the umbilicus.  The Kelly clamp was used to spread the subcutaneous tissues.  The fascia was elevated with 2 Kocher clamps and incised with the #11 blade.  A Claiborne Billings was used to confirm entrance into the peritoneal cavity.  A pursestring suture was placed around the fascial incision.  The Hasson trocar was inserted into the abdomen and held in place with the tails of the suture.  The pneumoperitoneum was then established to steady pressure of  15 mmHg.     Additional 5 mm cannulas then placed in the left lower quadrant of the abdomen and the suprapubic region under direct visualization.  A careful evaluation of the entire abdomen was carried out. The patient was placed in Trendelenburg and rotated to the left.  The small intestines were retracted in the cephalad and left lateral direction away from the pelvis and right lower quadrant. The patient was found to have an enlarged and inflamed appendix in the pelvic position. There was no evidence of perforation.  The appendix was carefully dissected. The appendix was was skeletonized with the harmonic scalpel.   The tip of the appendix pulled apart and was placed into an endocatch bag.  The remainder of the appendix was skeletonized and divided at its base using an endo-GIA stapler. No appreciable appendiceal stump was left in place. The appendix was removed from the abdomen with both Endocatch bags through the umbilical port.  There was no evidence of bleeding, leakage, or complication after division of the appendix. Irrigation was also performed and irrigate suctioned from the abdomen as well.  The 5 mm trocars were removed.  The pneumoperitoneum was evacuated from the abdomen.    The trocar site skin wounds were closed with 4-0 Monocryl and dressed with Dermabond.  Instrument, sponge, and needle counts were correct at the conclusion of the case.   Findings: The appendix was found to be inflamed. There were signs of necrosis.  There was not perforation. There was not abscess formation.  Estimated Blood Loss:  Minimal         Drains: none          Specimens: appendix to pathology         Complications:  None; patient tolerated the procedure well.         Disposition: PACU - hemodynamically stable.         Condition: stable

## 2019-03-18 NOTE — Transfer of Care (Signed)
Immediate Anesthesia Transfer of Care Note  Patient: Dustin Randall  Procedure(s) Performed: APPENDECTOMY LAPAROSCOPIC (N/A )  Patient Location: PACU  Anesthesia Type:General  Level of Consciousness: awake, alert  and oriented  Airway & Oxygen Therapy: Patient Spontanous Breathing and Patient connected to face mask oxygen  Post-op Assessment: Report given to RN and Post -op Vital signs reviewed and stable  Post vital signs: Reviewed and stable  Last Vitals:  Vitals Value Taken Time  BP 121/73 03/18/19 2031  Temp 37.1 C 03/18/19 2030  Pulse 86 03/18/19 2032  Resp 20 03/18/19 2032  SpO2 93 % 03/18/19 2032  Vitals shown include unvalidated device data.  Last Pain:  Vitals:   03/18/19 2030  TempSrc:   PainSc: 0-No pain         Complications: No apparent anesthesia complications

## 2019-03-18 NOTE — ED Provider Notes (Signed)
[facility] EMERGENCY DEPARTMENT Provider Note   CSN: [id number] Arrival date & time: 03/18/19  1311     History   Chief Complaint Chief Complaint  Patient presents with   Flank Pain    HPI Dustin Randall is a 58 y.o. male.     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  Patient states that about 2 weeks ago he passed a large kidney stone (he has this with him in a plastic bag today).  He denies any nausea or vomiting.  He is concerned because he has not been able to void since last night and is worried about an obstruction.  He does not feel like he has to use the restroom.  No fevers, chest pain, shortness of breath.  No scrotal pain.  Last CT for kidney stones in our system from 06/2011.     Past Medical History:  Diagnosis Date   Depression    Hypertension    Kidney stones    Pneumonia    Stroke [facility])     Patient Active Problem List   Diagnosis Date Noted   Chronic rhinitis 09/06/2018   Mild persistent asthma without complication 27/11/2374   Hypokalemia 06/07/2014   Essential hypertension 06/07/2014   Bronchiectasis ([place]) 06/05/2014   CAP (community acquired pneumonia) 06/05/2014   Chronic bronchitis ([place]) 06/05/2014   Sepsis ([place]) 06/05/2014    Past Surgical History:  Procedure Laterality Date   CYSTOSCOPY     HERNIA REPAIR     hip replacements     JOINT REPLACEMENT     KNEE SURGERY     LITHOTRIPSY          Home Medications    Prior to Admission medications   Medication Sig Start Date End Date Taking? Authorizing Provider  albuterol (PROVENTIL HFA;VENTOLIN HFA) 108 (90 BASE) MCG/ACT inhaler Inhale 1 puff into the lungs every 6 (six) hours as needed for wheezing or shortness of breath.    [provider]  albuterol (PROVENTIL) (2.5 MG/3ML) 0.083% nebulizer solution Take 2.5 mg by nebulization every 6 (six) hours as needed for wheezing or shortness of breath.    [provider]  Ascorbic Acid (VITAMIN C) 1000 MG tablet Take 1,000 mg by mouth daily.    [provider]  budesonide-formoterol (SYMBICORT) 160-4.5 MCG/ACT inhaler Inhale 2 puffs into the lungs 2 (two) times daily. 09/06/18   Tanda Rockers, MD  buPROPion (WELLBUTRIN XL) 150 MG 24 hr tablet Take 150 mg by mouth 2 (two) times daily.    [provider]  chlorpheniramine-HYDROcodone (TUSSIONEX PENNKINETIC ER) 10-8 MG/5ML SUER Take 5 mLs by mouth every 12 (twelve) hours as needed for cough. 06/07/17   Etta Quill, NP  cholecalciferol (VITAMIN D) 1000 UNITS tablet Take 1,000 Units by mouth daily.    [provider]  losartan (COZAAR) 25 MG tablet Take 25 mg by mouth daily.    [provider]  Multiple Vitamin (MULTIVITAMIN WITH MINERALS) TABS tablet Take 1 tablet by mouth daily.    [provider]  omeprazole (PRILOSEC OTC) 20 MG tablet Take 20 mg by mouth 2 (two) times daily before a meal.    [provider]  pravastatin (PRAVACHOL) 40 MG tablet Take 40 mg by mouth daily.    [provider]    [facility]  History Family History  Problem Relation Age of Onset   Heart disease Father     Social History Social History   Tobacco Use   Smoking status: Never Smoker   Smokeless tobacco: Never Used  Substance Use Topics   Alcohol use: No   Drug use: No     Allergies   Patient has no known allergies.   Review of Systems Review of Systems  Constitutional: Negative for fever.  HENT: Negative for rhinorrhea and sore throat.   Eyes: Negative for redness.  Respiratory: Negative for cough.   Cardiovascular: Negative for chest pain.  Gastrointestinal: Negative for abdominal pain, diarrhea, nausea and vomiting.  Genitourinary: Positive for difficulty  urinating and flank pain. Negative for dysuria, hematuria and urgency.  Musculoskeletal: Negative for myalgias.  Skin: Negative for rash.  Neurological: Negative for headaches.     Physical Exam Updated Vital Signs BP (!) 144/81 (BP Location: Left Arm)    Pulse 75    Temp 98.5 F (36.9 C) (Oral)    Resp 18    Ht 5\' 9"  (1.753 m)    Wt 94.3 kg    BMI 30.72 kg/m   Physical Exam Vitals signs and nursing note reviewed.  Constitutional:      Appearance: He is well-developed.  HENT:     Head: Normocephalic and atraumatic.  Eyes:     General:        Right eye: No discharge.        Left eye: No discharge.     Conjunctiva/sclera: Conjunctivae normal.  Neck:     Musculoskeletal: Normal range of motion and neck supple.  Cardiovascular:     Rate and Rhythm: Normal rate and regular rhythm.     Heart sounds: Normal heart sounds.  Pulmonary:     Effort: Pulmonary effort is normal.     Breath sounds: Normal breath sounds.  Abdominal:     Palpations: Abdomen is soft.     Tenderness: There is abdominal tenderness in the right lower quadrant. There is no guarding or rebound.     Comments: Patient reports tenderness to palpation over the right flank and RLQ.   Skin:    General: Skin is warm and dry.  Neurological:     Mental Status: He is alert.      ED Treatments / Results  Labs (all labs ordered are listed, but only abnormal results are displayed) Labs Reviewed  CBC WITH DIFFERENTIAL/PLATELET - Abnormal; Notable for the following components:      Result Value   WBC 14.5 (*)    Neutro Abs 12.4 (*)    Monocytes Absolute 1.1 (*)    All other components within normal limits  COMPREHENSIVE METABOLIC PANEL - Abnormal; Notable for the following components:   Glucose, Bld 113 (*)    BUN 22 (*)    All other components within normal limits  URINALYSIS, ROUTINE W REFLEX MICROSCOPIC - Abnormal; Notable for the following components:   Specific Gravity, Urine >1.030 (*)    Ketones, ur 15  (*)    All other components within normal limits  SARS CORONAVIRUS 2 (HOSPITAL ORDER, PERFORMED IN [facility])    EKG None  Radiology Ct Renal Stone Study  Result Date: 03/18/2019 CLINICAL DATA:  Flank pain. EXAM: CT ABDOMEN AND PELVIS WITHOUT CONTRAST TECHNIQUE: Multidetector CT imaging of the abdomen and pelvis was performed following the standard protocol without IV contrast. COMPARISON:  CT abdomen dated 07/05/2011. FINDINGS: Lower chest: No acute abnormality.  Hepatobiliary: No focal liver abnormality is seen. No gallstones, gallbladder wall thickening, or biliary dilatation. Pancreas: Partially infiltrated with fat but otherwise unremarkable. Spleen: Normal in size without focal abnormality. Adrenals/Urinary Tract: Adrenal glands appear normal. 1 mm nonobstructing RIGHT renal stone. LEFT kidney appears normal without stone or hydronephrosis. No perinephric fluid. No ureteral calculi identified, although distal ureters are partially obscured by metallic artifact emanating from patient's bilateral hip prostheses. Stomach/Bowel: Fairly extensive diverticulosis of the sigmoid and descending colon but no focal inflammatory change to suggest acute diverticulitis. Appendix is distended to approximately 10 mm, containing a 10 mm appendicolith. Subtle inflammatory stranding near the base of the appendix compatible with acute appendicitis. No dilated large or small bowel loops. Vascular/Lymphatic: No significant vascular findings are present. No enlarged abdominal or pelvic lymph nodes. Reproductive: Prostate is obscured by artifact emanating from patient's bilateral hip prostheses. Other: No free fluid or abscess collection identified. No free intraperitoneal air. Musculoskeletal: No acute or suspicious osseous finding. 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 IMPRESSION: 1. Acute mild/early appendicitis, as detailed above. No associated abscess collection or free intraperitoneal air. 2. Colonic  diverticulosis without evidence of acute diverticulitis. 3. 1 mm nonobstructing RIGHT renal stone.  No hydronephrosis. 4. Additional chronic/incidental findings detailed above. Electronically Signed   By: Bary RichardStan  Maynard M.D.   On: 03/18/2019 15:32    Procedures Procedures (including critical care time)  Medications Ordered in ED Medications - No data to display   Initial Impression / Assessment and Plan / ED Course  I have reviewed the triage vital signs and the nursing notes.  Pertinent labs & imaging results that were available during my care of the patient were reviewed by me and considered in my medical decision making (see chart for details).        Patient seen and examined. Work-up initiated. Medication declined.   Vital signs reviewed and are as follows: BP 123/86 (BP Location: Right Arm)    Pulse 75    Temp 98.5 F (36.9 C) (Oral)    Resp 18    Ht 5\' 9"  (1.753 m)    Wt 94.3 kg    SpO2 98%    BMI 30.72 kg/m   4:00 PM urine was clear and white blood cell count elevated.  Given this, CT renal protocol was ordered.  This was reviewed personally.  Patient with large appendicolith and inflamed appendix consistent with acute appendicitis.  Patient updated on results.  Will need admission for acute appendicitis.  4:19 PM Spoke with Dr. Donell BeersByerly of general surgery.  She accepts patient.  Will determine if patient can go right to the OR holding or will need to go to the ED first.  COVID pending.   Final Clinical Impressions(s) / ED Diagnoses   Final diagnoses:  Other acute appendicitis   Patient with acute appendicitis, uncomplicated.   ED Discharge Orders    None       Renne CriglerGeiple, Marcelo Ickes, PA-C 03/18/19 1622    [address], Adam, DO 03/21/19 [phone]

## 2019-03-18 NOTE — ED Notes (Signed)
ED Provider at bedside. 

## 2019-03-18 NOTE — ED Notes (Signed)
Given po fluids 

## 2019-03-18 NOTE — Anesthesia Postprocedure Evaluation (Signed)
Anesthesia Post Note  Patient: Dustin Randall  Procedure(s) Performed: APPENDECTOMY LAPAROSCOPIC (N/A )     Patient location during evaluation: PACU Anesthesia Type: General Level of consciousness: awake and alert Pain management: pain level controlled Vital Signs Assessment: post-procedure vital signs reviewed and stable Respiratory status: spontaneous breathing, nonlabored ventilation, respiratory function stable and patient connected to nasal cannula oxygen Cardiovascular status: blood pressure returned to baseline and stable Postop Assessment: no apparent nausea or vomiting Anesthetic complications: no    Last Vitals:  Vitals:   03/18/19 2135 03/18/19 2239  BP: 139/86 125/75  Pulse: 87 80  Resp: 13 18  Temp: 37.1 C 37.7 C  SpO2: 94% 97%    Last Pain:  Vitals:   03/18/19 2239  TempSrc: Oral  PainSc:                  Tollie Canada COKER

## 2019-03-19 ENCOUNTER — Encounter (HOSPITAL_COMMUNITY): Payer: Self-pay | Admitting: General Surgery

## 2019-03-19 LAB — CBC
HCT: 46.2 % (ref 39.0–52.0)
Hemoglobin: 14.7 g/dL (ref 13.0–17.0)
MCH: 30 pg (ref 26.0–34.0)
MCHC: 31.8 g/dL (ref 30.0–36.0)
MCV: 94.3 fL (ref 80.0–100.0)
Platelets: 205 10*3/uL (ref 150–400)
RBC: 4.9 MIL/uL (ref 4.22–5.81)
RDW: 13 % (ref 11.5–15.5)
WBC: 15.2 10*3/uL — ABNORMAL HIGH (ref 4.0–10.5)
nRBC: 0 % (ref 0.0–0.2)

## 2019-03-19 LAB — BASIC METABOLIC PANEL
Anion gap: 10 (ref 5–15)
BUN: 16 mg/dL (ref 6–20)
CO2: 23 mmol/L (ref 22–32)
Calcium: 8.6 mg/dL — ABNORMAL LOW (ref 8.9–10.3)
Chloride: 105 mmol/L (ref 98–111)
Creatinine, Ser: 1.1 mg/dL (ref 0.61–1.24)
GFR calc Af Amer: >60 mL/min (ref >60)
GFR calc non Af Amer: >60 mL/min (ref >60)
Glucose, Bld: 204 mg/dL — ABNORMAL HIGH (ref 70–99)
Potassium: 4.1 mmol/L (ref 3.5–5.1)
Sodium: 138 mmol/L (ref 135–145)

## 2019-03-19 MED ORDER — OXYCODONE HCL 5 MG PO TABS: 5.0000 mg | ORAL_TABLET | ORAL | 0 refills | Status: DC | PRN

## 2019-03-19 NOTE — Discharge Instructions (Signed)
Laparoscopic Appendectomy, Adult, Care After °This sheet gives you information about how to care for yourself after your procedure. Your doctor may also give you more specific instructions. If you have problems or questions, contact your doctor. °What can I expect after the procedure? °After the procedure, it is common to have: °· Little energy for normal activities. °· Mild pain in the area where the cuts from surgery (incisions) were made. °· Trouble pooping (constipation). This can be caused by: °? Pain medicine. °? A lack of activity. °Follow these instructions at home: °Medicines °· Take over-the-counter and prescription medicines only as told by your doctor. °· If you were prescribed an antibiotic medicine, take it as told by your doctor. Do not stop taking it even if you start to feel better. °· Do not drive or use heavy machinery while taking prescription pain medicine. °· Ask your doctor if the medicine you are taking can cause trouble pooping. You may need to take steps to prevent or treat trouble pooping: °? Drink enough fluid to keep your pee (urine) pale yellow. °? Take over-the-counter or prescription medicines. °? Eat foods that are high in fiber. These include beans, whole grains, and fresh fruits and vegetables. °? Limit foods that are high in fat and sugar. These include fried or sweet foods. °Incision care ° °· Follow instructions from your doctor about how to take care of your cuts from surgery. Make sure you: °? Wash your hands with soap and water before and after you change your bandage (dressing). If you cannot use soap and water, use hand sanitizer. °? Change your bandage as told by your doctor. °? Leave stitches (sutures), skin glue, or skin tape (adhesive) strips in place. They may need to stay in place for 2 weeks or longer. If tape strips get loose and curl up, you may trim the loose edges. Do not remove tape strips completely unless your doctor says it is okay. °· Check your cuts from  surgery every day for signs of infection. Check for: °? Redness, swelling, or pain. °? Fluid or blood. °? Warmth. °? Pus or a bad smell. °Bathing °· Keep your cuts from surgery clean and dry. Clean them as told by your doctor. To do this: °1. Gently wash the cuts with soap and water. °2. Rinse the cuts with water to remove all soap. °3. Pat the cuts dry with a clean towel. Do not rub the cuts. °· Do not take baths, swim, or use a hot tub for 2 weeks, or until your doctor says it is okay. You may take showers after 48 hours. °Activity ° °· Do not drive for 24 hours if you were given a medicine to help you relax (sedative) during your procedure. °· Rest after the procedure. Return to your normal activities as told by your doctor. Ask your doctor what activities are safe for you. °· For 3 weeks, or for as long as told by your doctor: °? Do not lift anything that is heavier than 10 lb (4.5 kg), or the limit that you are told. °? Do not play contact sports. °General instructions °· If you were sent home with a drain, follow instructions from your doctor on how to care for it. °· Take deep breaths. This helps to keep your lungs from getting an infection (pneumonia). °· Keep all follow-up visits as told by your doctor. This is important. °Contact a doctor if: °· You have redness, swelling, or pain around a cut from surgery. °·   You have fluid or blood coming from a cut. °· Your cut feels warm to the touch. °· You have pus or a bad smell coming from a cut or a bandage. °· The edges of a cut break open after the stitches have been taken out. °· You have pain in your shoulders that gets worse. °· You feel dizzy or you pass out (faint). °· You have shortness of breath. °· You keep feeling sick to your stomach (nauseous). °· You keep throwing up (vomiting). °· You get watery poop (diarrhea) or you cannot control your poop. °· You lose your appetite. °· You have swelling or pain in your legs. °· You get a rash. °Get help right  away if: °· You have a fever. °· You have trouble breathing. °· You have sharp pains in your chest. °Summary °· After the procedure, it is common to have low energy, mild pain, and trouble pooping. °· Infection is a common problem after this procedure. Follow your doctor's instructions about caring for yourself after the procedure. °· Rest after the procedure. Return to your normal activities as told by your doctor. °· Contact your doctor if you see signs of infection around your cuts from surgery, or you get short of breath. Get help right away if you have a fever, chest pain, or trouble breathing. °This information is not intended to replace advice given to you by your health care provider. Make sure you discuss any questions you have with your health care provider. °Document Released: 04/10/2009 Document Revised: 12/15/2017 Document Reviewed: 12/15/2017 °Elsevier Patient Education © 2020 [employer] Inc[employer] ° °

## 2019-03-19 NOTE — Discharge Summary (Signed)
Physician Discharge Summary  Patient ID: Dustin Randall MRN: [id number] DOB/AGE: 60/29/1962 60 y.o.  Admit date: 03/18/2019 Discharge date: 03/19/2019  Admission Diagnoses: appendicitis  Discharge Diagnoses:  Active Problems:   Acute appendicitis   Discharged Condition: good  Hospital Course: Pt admitted for observation after lap appendectomy.  Consults: None  Significant Diagnostic Studies: labs: cbc  Treatments: IV hydration, antibiotics: Zosyn, analgesia: acetaminophen and surgery: lap appendectomy  Discharge Exam: Blood pressure 119/71, pulse 91, temperature 99 F (37.2 C), temperature source Oral, resp. rate 18, height 5\' 9"  (1.753 m), weight 94.3 kg, SpO2 94 %. General appearance: alert and cooperative GI: normal findings: soft, non-tender Incision/Wound: clean, dry, intact  Disposition: home   Allergies as of 03/19/2019   No Known Allergies     Medication List    STOP taking these medications   chlorpheniramine-HYDROcodone 10-8 MG/5ML Suer Commonly known as: Tussionex Pennkinetic ER     TAKE these medications   albuterol 108 (90 Base) MCG/ACT inhaler Commonly known as: VENTOLIN HFA Inhale 1 puff into the lungs every 6 (six) hours as needed for wheezing or shortness of breath.   albuterol (2.5 MG/3ML) 0.083% nebulizer solution Commonly known as: PROVENTIL Take 2.5 mg by nebulization every 6 (six) hours as needed for wheezing or shortness of breath.   budesonide-formoterol 160-4.5 MCG/ACT inhaler Commonly known as: Symbicort Inhale 2 puffs into the lungs 2 (two) times daily. What changed: when to take this   buPROPion 150 MG 24 hr tablet Commonly known as: WELLBUTRIN XL Take 150 mg by mouth 2 (two) times daily.   cholecalciferol 1000 units tablet Commonly known as: VITAMIN D Take 1,000 Units by mouth daily.   cyclobenzaprine 5 MG tablet Commonly known as: FLEXERIL Take 5 mg by mouth at bedtime.   losartan 25 MG tablet Commonly known as:  COZAAR Take 25 mg by mouth daily.   multivitamin with minerals Tabs tablet Take 1 tablet by mouth daily.   omeprazole 20 MG tablet Commonly known as: PRILOSEC OTC Take 20 mg by mouth daily as needed (heartburn).   oxybutynin 5 MG 24 hr tablet Commonly known as: DITROPAN-XL Take 5 mg by mouth at bedtime.   oxyCODONE 5 MG immediate release tablet Commonly known as: Oxy IR/ROXICODONE Take 1 tablet (5 mg total) by mouth every 4 (four) hours as needed for moderate pain.   pravastatin 40 MG tablet Commonly known as: PRAVACHOL Take 40 mg by mouth at bedtime.   vitamin C 1000 MG tablet Take 1,000 mg by mouth daily.      Follow-up [facility], [place]. Schedule an appointment as soon as possible for a visit in 2 week(s).   Specialty: General Surgery Contact information: [address] [place] [phone]          Signed: Rosario Adie 8/67/6720, 8:58 AM

## 2019-03-19 NOTE — Progress Notes (Signed)
Discharge instructions given to patient and his wife. Patient had no questions. NT wheel patient out

## 2019-03-29 LAB — SURGICAL PATHOLOGY

## 2019-04-18 ENCOUNTER — Ambulatory Visit: Payer: 59 | Admitting: Internal Medicine

## 2019-04-20 ENCOUNTER — Ambulatory Visit: Payer: Managed Care, Other (non HMO) | Admitting: Internal Medicine

## 2019-05-28 ENCOUNTER — Ambulatory Visit (INDEPENDENT_AMBULATORY_CARE_PROVIDER_SITE_OTHER): Payer: Managed Care, Other (non HMO) | Admitting: Internal Medicine

## 2019-05-28 ENCOUNTER — Telehealth: Payer: Self-pay | Admitting: Internal Medicine

## 2019-05-28 ENCOUNTER — Encounter: Payer: Self-pay | Admitting: Internal Medicine

## 2019-05-28 ENCOUNTER — Other Ambulatory Visit: Payer: Self-pay

## 2019-05-28 DIAGNOSIS — J4530 Mild persistent asthma, uncomplicated: Secondary | ICD-10-CM | POA: Diagnosis not present

## 2019-05-28 MED ORDER — PREDNISONE 10 MG PO TABS: ORAL_TABLET | ORAL | 0 refills | Status: DC

## 2019-05-28 MED ORDER — AZITHROMYCIN 250 MG PO TABS: ORAL_TABLET | ORAL | 0 refills | Status: DC

## 2019-05-28 NOTE — Patient Instructions (Signed)
mucinex dm 1200 mg every 12 hours and use the cough syrup at night   Only use your albuterol as a rescue medication to be used if you can't catch your breath by resting or doing a relaxed purse lip breathing pattern.  - The less you use it, the better it will work when you need it. - Ok to use up to 2 puffs  every 4 hours if you must but call for immediate appointment if use goes up over your usual need - Don't leave home without it !!  (think of it like the spare tire for your car)    zpak  Prednisone 10 mg take  4 each am x 2 days,   2 each am x 2 days,  1 each am x 2 days and stop

## 2019-05-28 NOTE — Telephone Encounter (Signed)
Spoke with patient's wife. She said that the patient was at work, that's why she was calling on his behalf. He does not get off from work until [employer]. She will send him a text to tell him to be available at 415 today. Advised her to call our office back if that time was not ok, she verbalized understanding.

## 2019-05-28 NOTE — Telephone Encounter (Signed)
Convert to televisit, add on to end of day for me if no other slots including nps

## 2019-05-28 NOTE — Assessment & Plan Note (Signed)
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 when -  08/07/2018   resume symb 80 2bid > improved about 50% clinically - FENO 09/06/2018  =   44 on symb 80 2 bid with good hfa - 09/06/2018    increased symb to 160 2bid - 01/16/2019  After extensive coaching inhaler device,  effectiveness =    90%   Flare assoc with uri but @  baseline All goals of chronic asthma control met including optimal function and elimination of symptoms with minimal need for rescue therapy.  Contingencies discussed in full including contacting this office immediately if not controlling the symptoms using the rule of two's.     rec zpak pred x 6 day taper Increase saba to q 4 h until turning the corner then back to q4h prn  mucinex dm 1200 mg every 12 hours as needed To er for covid19 testing if getting worse    Each maintenance medication was reviewed in detail including most importantly the difference between maintenance and as needed and under what circumstances the prns are to be used.  Please see AVS for specific  Instructions which are unique to this visit and I personally typed out  which were reviewed in detail over the phone with the patient and a copy provided  Via mail

## 2019-05-28 NOTE — Progress Notes (Signed)
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    History of Present Illness  06/18/2014 1st [facility] office visit/ Shakeria Robinette   Chief Complaint  Patient presents with  . Pulmonary Consult    Self referral. Pt states that he has had PNA x 3 in the past year.  He c/o cough and SOB since Sept 2015.  He states that he gets SOB with minimal exertion and "takes short breaths at rest". His cough is non prod at this time. He c/o sore throat just since this am. He is using proair about once per day and neb with albuterol 2 x per day.   onset was insidious p started new job 2 y prior to OV  Cough non prod first then worsening sob Albuterol helps some/ prednisone helps a lot and saba  not needed as much when on vacation  Does better at home or vacation but still cough and sob with exertion. rec Symbicort 80 Take 2 puffs first thing in am and then another 2 puffs about 12 hours later.  only use your albuterol (proair)  As rescue  GERD  Diet  Try prilosec 20mg  Take 30-60 min before first meal of the day and Pepcid 20 mg one at  bedtime until return Please remember to go to the lab department downstairs for your tests - we will call you with the results when they are available.    07/22/2014 f/u ov/Rayane Gallardo re: symbicort 80 2bid not as good about using the prilosec/pepcid and no noct cough now Chief Complaint  Patient presents with  . Follow-up    PFT done today. Cough has resolved. His breathing is unchanged. He has not had to use albuterol neb or inhaler in the past wk.   thinks breathing/ coughing better as not working much over the holidays - has  respirator for when returns to work  rec Symbicort 160 Take 2 puffs first thing in am and then another 2 puffs about 12 hours later  Only use your albuterol as a rescue medication  If not satisfied > singulair 10 mg per day and then do sinus ct to decide re  allergy evaluation vs ent eval  Late add : full pneumococcal pna rx     08/07/2018  New pt eval / cough turned yellow / worse at work when lots of passive smoking  Chief Complaint  Patient presents with  . Pulmonary Consult    Self referral. Seen here before- last ov 07/22/2014. Pt c/o cough for the past 2 months- prod with clear to yellow sputum.  He also c/o SOB with or without any exertion.   Dyspnea:  worse x 8 weeks  Cough: ever since last eval,  better sleeping at 45 degrees but last 8 weeks more productive worse in ams minimally discolored  Sleeping:  In recliner/ o/w choking sensation immediately  SABA use: uses twice a day  rec Pneumovax today then again at age 58  Plan A = Automatic = Symbicort 80 Take 2 puffs first thing in am and then another 2 puffs about 12 hours later.  Work on inhaler technique:  Plan B = Backup Only use your albuterol inhaler   Prilosec 20 mg Take 30- 60 min before your first and last meals of the day  GERD diet  - late add:  Prednisone 10 mg take  4 each am x 2 days,   2 each am x 2 days,  1 each am x 2 days and stop     09/06/2018  f/u ov/Markeem Noreen re: mild chronic asthma/chronic rhinitis/ allergy to dog but still sleeps in br  Chief Complaint  Patient presents with  . Follow-up    Breathing has improved and he is coughing less. His cough is usually prod with clear to yellow sputum. He has not had to use his albuterol inhaler or neb since the last visit.    Dyspnea:  Not limited by breathing from desired activities  / hips slow down > sob  Cough:   Attributes to  nasal drainage, severe nasal congestion each am with slt discolored mucus Sleeping: still at 45 degreees  SABA use: not using rec  Increase symbicort to 160 Take 2 puffs first thing in am and then another 2 puffs about 12 hours later.  Work on perfect  inhaler technique:   Keep dog out of bedroom  Please see patient coordinator before you leave today  to schedule ENT eval for chronic severe nasal congestion to see if sinus CT warranted as your insurance won't approve one without their input > never done/ improved on its own    01/16/2019  f/u ov/Quintasia Theroux re: mild chronic asthma / on symb 160 2bid  Chief Complaint  Patient presents with  . Follow-up    Breathing is overall doing well and he uses his albuterol once per wk on average.   Dyspnea:  Hips slow him down, back at work painting trucks Cough: variable related to sense of pnds  Sleeping: bed blocks  SABA use: no need for rescue 02: none  rec No change rx    Acute Virtual Visit via Telephone Note 05/28/2019   I connected with Cederic L Girton on 05/28/19 at 5:00 PM EST by telephone and verified that I am speaking with the correct person using two identifiers.   I discussed the limitations, risks, security and privacy concerns of performing an evaluation and management service by telephone and the availability of in person appointments. I also discussed with the patient that there may be a patient responsible charge related to this service. The patient expressed understanding and agreed to proceed.   History of Present Illness:  Mild chronic asthma/ acute flare x one week  "same problem as always" x one week started as head cold and went to chest / had been doing fine s need saba  Dyspnea:  Able to work today wearing respirator Cough: severe cough/  yellow mucus/ has not tried mucinex dm / some fatigue but no fever, st, ha, body aches, loss of smell or sob  Sleeping: bed blocks and still sleeping ok SABA use: neither saba hfa or neb yet used during this flare  02: none    No obvious day to day or daytime variability or assoc  mucus plugs or hemoptysis or cp or  chest tightness, subjective wheeze or overt sinus or hb symptoms.    Also denies any obvious fluctuation of symptoms with weather or environmental changes or other aggravating or alleviating factors except as outlined above.   Meds reviewed/ med reconciliation completed        Observations/Objective: Sounds fine on  the phone x a little hoarse, no increased tachypnea/wob noted and cough to voluntary maneuver is slt congested sounding  Assessment and Plan: See problem list for active a/p's   Follow Up Instructions: See avs for instructions unique to this ov which includes revised/ updated med list     I discussed the assessment and treatment plan with the patient. The patient was provided an opportunity to ask questions and all were answered. The patient agreed with the plan and demonstrated an understanding of the instructions.   The patient was advised to call back or seek an in-person evaluation if the symptoms worsen or if the condition fails to improve as anticipated.  I provided 25  minutes of non-face-to-face time during this encounter.   Sandrea HughsMichael Kinta Martis, MD

## 2019-05-28 NOTE — Telephone Encounter (Signed)
Spoke with patient's wife Juliann Pulse. She stated that the patient had an appt to see Dr. Melvyn Novas back in October but he had to cancel due to having emergency surgery. For the past week he has developed a cough with yellow phlegm, nasal congestion and increased fatigue. She wanted to know if MW would be willing to call in antibiotics and prednisone to get him stable and then get him scheduled for an OV. She is concerned that this will turn into PNA like it has in the years past.   Pharmacy is [facility] in [place].   MW, please advise.

## 2020-06-16 ENCOUNTER — Telehealth: Payer: Self-pay | Admitting: Internal Medicine

## 2020-06-16 DIAGNOSIS — J4530 Mild persistent asthma, uncomplicated: Secondary | ICD-10-CM

## 2020-06-16 MED ORDER — PREDNISONE 10 MG PO TABS: ORAL_TABLET | ORAL | 0 refills | Status: DC

## 2020-06-16 NOTE — Telephone Encounter (Signed)
Not seen in a year so rec  1) make appt for 12/22 and bring all active med  Plan A  Continue symbicort 2 every 12 h plus Prednisone 10 mg take  4 each am x 2 days,   2 each am x 2 days,  1 each am x 2 days and stop   Plan B = Backup (to supplement plan A, not to replace it) Only use your albuterol inhaler as a rescue medication to be used if you can't catch your breath by resting or doing a relaxed purse lip breathing pattern.  - The less you use it, the better it will work when you need it. - Ok to use the inhaler up to 2 puffs  every 4 hours in short run only   Plan C = Crisis (instead of Plan B but only if Plan B stops working) - only use your albuterol nebulizer if you first try Plan B and it fails to help > ok to use the nebulizer up to every 4 hours in short run only

## 2020-06-16 NOTE — Telephone Encounter (Addendum)
Called and spoke with pt's wife Dustin Randall who states pt has had symptoms x3 weeks of worsening wheezing. She said when pt clears his throat and tries to cough, he is unable to get any phlegm up. She said when pt is breathing, she can hear crackling/gurgling in pt's chest.  Dustin Randall said that she is afraid that pt might have pna due to this being how his symptoms usually begin when he has had pna in the past.  Asked if pt was running any fever and she said that she did not know due to pt not checking temp and also due to pt being at work and not at home where temp could be checked.  Asked Dustin Randall if pt has used the albuterol inhaler any and she said at least 2-3 times daily. Pt has not used nebulizer. Dustin Randall states that pt is using symbicort inhaler as prescribed along with all other meds.  Dustin Randall stated that pt has received two of his covid vaccines receiving the Moderna vaccine. Pt has not received his flu shot as he does not get the flu shots.  Pt is scheduled to have colon surgery consultation 07/16/20 and she is wanting any recommendations for pt as she is hoping to get things taken care of prior to him having the consultation.  First avail appt with APP is 12/22. Dr. Sherene Sires, please advise.

## 2020-06-16 NOTE — Telephone Encounter (Signed)
Called and spoke with Olegario Messier letting her know the info stated by MW and she verbalized understanding. appt has been scheduled for pt. Sated to her for pt to use Symbicort bid plus the prednisone and she stated that pt was out of prednisone. Refill of pt's prednisone Rx has been sent in for pt. Nothing further needed.

## 2020-06-16 NOTE — Telephone Encounter (Signed)
Please call [phone]

## 2020-06-18 ENCOUNTER — Encounter: Payer: Self-pay | Admitting: Primary Care

## 2020-06-18 ENCOUNTER — Ambulatory Visit (INDEPENDENT_AMBULATORY_CARE_PROVIDER_SITE_OTHER): Payer: BLUE CROSS/BLUE SHIELD | Admitting: Primary Care

## 2020-06-18 ENCOUNTER — Ambulatory Visit (INDEPENDENT_AMBULATORY_CARE_PROVIDER_SITE_OTHER): Payer: BLUE CROSS/BLUE SHIELD

## 2020-06-18 ENCOUNTER — Other Ambulatory Visit: Payer: Self-pay

## 2020-06-18 VITALS — BP 122/74 | HR 83 | Temp 97.5°F | Ht 69.0 in | Wt 211.2 lb

## 2020-06-18 DIAGNOSIS — J479 Bronchiectasis, uncomplicated: Secondary | ICD-10-CM

## 2020-06-18 DIAGNOSIS — J4531 Mild persistent asthma with (acute) exacerbation: Secondary | ICD-10-CM

## 2020-06-18 MED ORDER — AZITHROMYCIN 250 MG PO TABS: ORAL_TABLET | ORAL | 0 refills | Status: DC

## 2020-06-18 MED ORDER — BUDESONIDE-FORMOTEROL FUMARATE 160-4.5 MCG/ACT IN AERO: 2.0000 | INHALATION_SPRAY | Freq: Two times a day (BID) | RESPIRATORY_TRACT | 11 refills | Status: AC

## 2020-06-18 MED ORDER — BUDESONIDE-FORMOTEROL FUMARATE 160-4.5 MCG/ACT IN AERO: 2.0000 | INHALATION_SPRAY | Freq: Two times a day (BID) | RESPIRATORY_TRACT | 11 refills | Status: DC

## 2020-06-18 MED ORDER — ALBUTEROL SULFATE (2.5 MG/3ML) 0.083% IN NEBU: 2.5000 mg | INHALATION_SOLUTION | Freq: Four times a day (QID) | RESPIRATORY_TRACT | 2 refills | Status: AC | PRN

## 2020-06-18 MED ORDER — PROAIR RESPICLICK 108 (90 BASE) MCG/ACT IN AEPB: 2.0000 | INHALATION_SPRAY | Freq: Four times a day (QID) | RESPIRATORY_TRACT | 2 refills | Status: DC | PRN

## 2020-06-18 NOTE — Progress Notes (Signed)
@Patient  ID: , male    DOB: 11-05-1960, 59 y.o.   MRN: [id number]  Chief Complaint  Patient presents with  . Follow-up    Both non productive and productive cough with with yellow phlegm     Referring provider: [id number]., MD  HPI: 59 year old male, never smoked. PMH significant for mild persistent asthma, HTN, bronchiectasis, CAP. Patient of Dr. 68, last seen on 06/16/20.   06/18/2020  Patient presents today for regular follow-up. He has done pretty well over the last year. He tends to get sick around this time of year. He has a cough which is occasionally prod with yellow mucs for 2 weeks. He was sent in prednisone taper two days ago by Dr. 06/20/2020. He has not tried any over the counter medication. Using Symbicort 160 two puffs twice daily. He has been requiring albuterol rescue inhaler 2-3 times a day.    No Known Allergies  Immunization History  Administered Date(s) Administered  . Moderna Sars-Covid-2 Vaccination 01/03/2020, 01/31/2020  . Pneumococcal Polysaccharide-23 08/07/2018    Past Medical History:  Diagnosis Date  . Depression   . Hypertension   . Kidney stones   . Pneumonia   . Stroke [facility])     Tobacco History: Social History   Tobacco Use  Smoking Status Never Smoker  Smokeless Tobacco Never Used   Counseling given: Not Answered   Outpatient Medications Prior to Visit  Medication Sig Dispense Refill  . Ascorbic Acid (VITAMIN C) 1000 MG tablet Take 1,000 mg by mouth daily.    [facility] buPROPion (WELLBUTRIN XL) 150 MG 24 hr tablet Take 150 mg by mouth 2 (two) times daily.    . cholecalciferol (VITAMIN D) 1000 UNITS tablet Take 1,000 Units by mouth daily.    . cyclobenzaprine (FLEXERIL) 5 MG tablet Take 5 mg by mouth at bedtime.    Marland Kitchen losartan (COZAAR) 25 MG tablet Take 25 mg by mouth daily.    . Multiple Vitamin (MULTIVITAMIN WITH MINERALS) TABS tablet Take 1 tablet by mouth daily.    Marland Kitchen oxybutynin (DITROPAN-XL) 5 MG 24 hr tablet Take 5 mg  by mouth at bedtime.     . pravastatin (PRAVACHOL) 40 MG tablet Take 40 mg by mouth at bedtime.     . predniSONE (DELTASONE) 10 MG tablet Take 10 mg by mouth daily with breakfast. Take 4 each am x2 days, take 2 each am x2 days, take 1 each am x2 days, and STOP    . albuterol (PROVENTIL HFA;VENTOLIN HFA) 108 (90 BASE) MCG/ACT inhaler Inhale 1 puff into the lungs every 6 (six) hours as needed for wheezing or shortness of breath.    Marland Kitchen albuterol (PROVENTIL) (2.5 MG/3ML) 0.083% nebulizer solution Take 2.5 mg by nebulization every 6 (six) hours as needed for wheezing or shortness of breath.    . budesonide-formoterol (SYMBICORT) 160-4.5 MCG/ACT inhaler Inhale 2 puffs into the lungs 2 (two) times daily. (Patient taking differently: Inhale 2 puffs into the lungs daily.) 1 Inhaler 11  . predniSONE (DELTASONE) 10 MG tablet Take  4 each am x 2 days,   2 each am x 2 days,  1 each am x 2 days and stop 14 tablet 0  . omeprazole (PRILOSEC OTC) 20 MG tablet Take 20 mg by mouth daily as needed (heartburn).  (Patient not taking: Reported on 06/18/2020)    . oxyCODONE (OXY IR/ROXICODONE) 5 MG immediate release tablet Take 1 tablet (5 mg total) by mouth every 4 (four)  hours as needed for moderate pain. (Patient not taking: Reported on 06/18/2020) 15 tablet 0   No facility-administered medications prior to visit.   Review of Systems  Review of Systems  Respiratory: Positive for cough.    Physical Exam  BP 122/74 (BP Location: Right Arm, Cuff Size: Normal)   Pulse 83   Temp (!) 97.5 F (36.4 C) (Other (Comment)) Comment (Src): wrist  Ht 5\' 9"  (1.753 m)   Wt 211 lb 3.2 oz (95.8 kg)   SpO2 95% Comment: room air  BMI 31.19 kg/m  Physical Exam Constitutional:      Appearance: Normal appearance.  Cardiovascular:     Rate and Rhythm: Normal rate and regular rhythm.  Pulmonary:     Effort: Pulmonary effort is normal.     Breath sounds: Normal breath sounds. No wheezing, rhonchi or rales.  Musculoskeletal:         General: Normal range of motion.  Skin:    General: Skin is warm and dry.  Neurological:     General: No focal deficit present.     Mental Status: He is alert and oriented to person, place, and time. Mental status is at baseline.  Psychiatric:        Mood and Affect: Mood normal.        Behavior: Behavior normal.        Thought Content: Thought content normal.        Judgment: Judgment normal.      Lab Results:  CBC    Component Value Date/Time   WBC 15.2 (H) 03/19/2019 0326   RBC 4.90 03/19/2019 0326   HGB 14.7 03/19/2019 0326   HCT 46.2 03/19/2019 0326   PLT 205 03/19/2019 0326   MCV 94.3 03/19/2019 0326   MCH 30.0 03/19/2019 0326   MCHC 31.8 03/19/2019 0326   RDW 13.0 03/19/2019 0326   LYMPHSABS 0.8 03/18/2019 1411   MONOABS 1.1 (H) 03/18/2019 1411   EOSABS 0.1 03/18/2019 1411   BASOSABS 0.0 03/18/2019 1411    BMET    Component Value Date/Time   NA 138 03/19/2019 0326   K 4.1 03/19/2019 0326   CL 105 03/19/2019 0326   CO2 23 03/19/2019 0326   GLUCOSE 204 (H) 03/19/2019 0326   BUN 16 03/19/2019 0326   CREATININE 1.10 03/19/2019 0326   CALCIUM 8.6 (L) 03/19/2019 0326   GFRNONAA >60 03/19/2019 0326   GFRAA >60 03/19/2019 0326    BNP No results found for: BNP  ProBNP No results found for: PROBNP  Imaging: DG Chest 2 View  Result Date: 06/18/2020 CLINICAL DATA:  Cough EXAM: CHEST - 2 VIEW COMPARISON:  08/07/2018 FINDINGS: Cardiac shadow is within normal limits. Tortuous thoracic aorta is again seen. The lungs are well aerated bilaterally. No focal infiltrate or effusion is seen. No bony abnormality noted. IMPRESSION: No active cardiopulmonary disease. Electronically Signed   By: 10/06/2018 M.D.   On: 06/18/2020 16:40     Assessment & Plan:   Asthmatic bronchitis with exacerbation - Patient has been doing well the last year. He tends to get bronchitis symptoms this time of year. He has a productive cough with yellow mucus.  - Treating patient for  acute exacerbation of asthmatic bronchitis. He was started on prednisone 2 days ago, adding azithromycin course. Encourage patient take mucinex 1,200mg  twice daily and we will give patient flutter valve to use three times a day  - Continue Symbicort 160 TWO puffs twice daily and albuterol rescue inhaler 2  puffs every 4-6 hours for breakthrough shortness of breath or wheezing    Glenford Bayley, NP 07/08/2020

## 2020-06-18 NOTE — Patient Instructions (Addendum)
Recommendations: - Sending in Zpack to take for bronchitis symptoms  - Continue prednisone taper until complete - Take Mucinex 1,200mg  twice with glass of water - We will give you a flutter valve to use 2-3 times a day to help loose secretions  - Continue Symbicort two puffs twice daily - Refills sent to pharmacy   Follow-up:  Dr. Sherene Sires in 6 months or sooner if you do not improve

## 2020-07-08 DIAGNOSIS — J45901 Unspecified asthma with (acute) exacerbation: Secondary | ICD-10-CM | POA: Insufficient documentation

## 2020-07-08 NOTE — Assessment & Plan Note (Addendum)
-   Patient has been doing well the last year. He tends to get bronchitis symptoms this time of year. He has a productive cough with yellow mucus.  - Treating patient for acute exacerbation of asthmatic bronchitis. He was started on prednisone 2 days ago, adding azithromycin course. Encourage patient take mucinex 1,200mg  twice daily and we will give patient flutter valve to use three times a day  - Continue Symbicort 160 two puffs twice daily and albuterol rescue inhaler 2 puffs every 4-6 hours for breakthrough shortness of breath or wheezing - FU in 6 months or sooner if needed

## 2020-12-15 ENCOUNTER — Ambulatory Visit: Payer: BLUE CROSS/BLUE SHIELD | Admitting: Internal Medicine

## 2020-12-16 ENCOUNTER — Other Ambulatory Visit: Payer: Self-pay

## 2020-12-16 ENCOUNTER — Ambulatory Visit (INDEPENDENT_AMBULATORY_CARE_PROVIDER_SITE_OTHER): Payer: BLUE CROSS/BLUE SHIELD | Admitting: Primary Care

## 2020-12-16 VITALS — BP 122/70 | HR 89 | Temp 97.8°F | Ht 69.0 in | Wt 212.0 lb

## 2020-12-16 DIAGNOSIS — J310 Chronic rhinitis: Secondary | ICD-10-CM | POA: Diagnosis not present

## 2020-12-16 DIAGNOSIS — J4530 Mild persistent asthma, uncomplicated: Secondary | ICD-10-CM | POA: Diagnosis not present

## 2020-12-16 DIAGNOSIS — J42 Unspecified chronic bronchitis: Secondary | ICD-10-CM

## 2020-12-16 MED ORDER — PREDNISONE 10 MG PO TABS: ORAL_TABLET | ORAL | 0 refills | Status: AC

## 2020-12-16 MED ORDER — DOXYCYCLINE HYCLATE 100 MG PO TABS: 100.0000 mg | ORAL_TABLET | Freq: Two times a day (BID) | ORAL | 0 refills | Status: AC

## 2020-12-16 MED ORDER — ALBUTEROL SULFATE HFA 108 (90 BASE) MCG/ACT IN AERS: 2.0000 | INHALATION_SPRAY | Freq: Four times a day (QID) | RESPIRATORY_TRACT | 6 refills | Status: DC | PRN

## 2020-12-16 NOTE — Assessment & Plan Note (Addendum)
-   Never smoked. Patient gets recurrent bronchitis symptoms 1-2 times a year. He had mild bronchiectasis and mucus plugging on CTA imaging back in 2015. He has been doing well until he developed chest congestion/cough 4 weeks ago. Lungs mostly clear on exam today except for scant wheeze. Recommend treated for acute exacerbation with Doxycycline 100mg  x 10 days and 20mg  prednisone x 5 days. Advised he take mucinex 1200mg  twice daily for chest congestion. He needs repeat PFTs in 6 months, monitor lung function and DLCO as he works as .

## 2020-12-16 NOTE — Patient Instructions (Addendum)
Recommendations: Continue Symbicort two puffs twice daily  Continue Claritin 10mg  daily  Take mucinex 1200mg  twice a day for congestion Start flonase 1 puff per nostril once daily  Rx: Doxycycline 1 tab twice daily x 10 days  Albuterol hfa (interchange for preferred formulary)  Orders: PFTs in 6 months   Follow-up: 6 months with Dr. 

## 2020-12-16 NOTE — Assessment & Plan Note (Signed)
-   Continue Symbicort 160 two puffs twice daily prn albuterol 2 puffs every 6 hours for sob/wheezing - Refills provided today - FU with Dr. Sherene Sires in 6 months or sooner if needed

## 2020-12-16 NOTE — Progress Notes (Signed)
@Patient  ID: , male    DOB: 1960/11/15, 60 y.o.   MRN: [id number]  No chief complaint on file.   Referring provider: [id number]., MD  HPI: 60 year old male, never smoked. PMH significant for HTN, mild persistent asthma, chronic bronchitis, CAP, mild bronchiectasis. Patient of Dr. 77, last seen by pulmonary NP on 06/18/20. Maintained on Symbciort 160 two puffs twice daily, prn albuterol.   12/16/2020- Interim hx  Patient presents today for 6 month follow-up. He is doing alright. Over the last 4 weeks he reports increased chest congestion. He has a productive cough with clear sputum. Associated nasal congestion. He tried mucinex for a few days several weeks ago. He is not currently using any OTC nasal sprays. He is complaint with Symbicort 160, recently needing to use albuterol rescue inhaler 1-2 times a day. Denies f/c/s, chest tightness, wheezing, N/V/D.    No Known Allergies  Immunization History  Administered Date(s) Administered   Moderna Sars-Covid-2 Vaccination 01/03/2020, 01/31/2020   Pneumococcal Polysaccharide-23 08/07/2018    Past Medical History:  Diagnosis Date   Depression    Hypertension    Kidney stones    Pneumonia    Stroke (HCC)     Tobacco History: Social History   Tobacco Use  Smoking Status Never  Smokeless Tobacco Never   Counseling given: Not Answered   Outpatient Medications Prior to Visit  Medication Sig Dispense Refill   albuterol (PROVENTIL) (2.5 MG/3ML) 0.083% nebulizer solution Take 3 mLs (2.5 mg total) by nebulization every 6 (six) hours as needed for wheezing or shortness of breath. 75 mL 2   Ascorbic Acid (VITAMIN C) 1000 MG tablet Take 1,000 mg by mouth daily.     budesonide-formoterol (SYMBICORT) 160-4.5 MCG/ACT inhaler Inhale 2 puffs into the lungs 2 (two) times daily. 1 each 11   buPROPion (WELLBUTRIN XL) 150 MG 24 hr tablet Take 150 mg by mouth 2 (two) times daily.     cholecalciferol (VITAMIN D) 1000 UNITS  tablet Take 1,000 Units by mouth daily.     cyclobenzaprine (FLEXERIL) 5 MG tablet Take 5 mg by mouth at bedtime.     losartan (COZAAR) 25 MG tablet Take 25 mg by mouth daily.     Multiple Vitamin (MULTIVITAMIN WITH MINERALS) TABS tablet Take 1 tablet by mouth daily.     oxybutynin (DITROPAN-XL) 5 MG 24 hr tablet Take 5 mg by mouth at bedtime.      pravastatin (PRAVACHOL) 40 MG tablet Take 40 mg by mouth at bedtime.      omeprazole (PRILOSEC OTC) 20 MG tablet Take 20 mg by mouth daily as needed (heartburn).  (Patient not taking: Reported on 12/16/2020)     Albuterol Sulfate (PROAIR RESPICLICK) 108 (90 Base) MCG/ACT AEPB Inhale 2 puffs into the lungs every 6 (six) hours as needed. 1 each 2   azithromycin (ZITHROMAX) 250 MG tablet Zpack taper as directed 6 tablet 0   predniSONE (DELTASONE) 10 MG tablet Take 10 mg by mouth daily with breakfast. Take 4 each am x2 days, take 2 each am x2 days, take 1 each am x2 days, and STOP     No facility-administered medications prior to visit.   Review of Systems  Review of Systems  Constitutional: Negative.   HENT:  Positive for congestion and postnasal drip.   Respiratory:  Positive for cough and shortness of breath. Negative for chest tightness and wheezing.     Physical Exam  BP 122/70   Pulse 89  Temp 97.8 F (36.6 C)   Ht 5\' 9"  (1.753 m)   Wt 212 lb (96.2 kg)   SpO2 96%   BMI 31.31 kg/m  Physical Exam Constitutional:      Appearance: Normal appearance.  HENT:     Head: Normocephalic and atraumatic.     Mouth/Throat:     Mouth: Mucous membranes are moist.     Pharynx: Oropharynx is clear.  Cardiovascular:     Rate and Rhythm: Normal rate and regular rhythm.  Pulmonary:     Effort: Pulmonary effort is normal.     Comments: Insp wheeze left upper lobe  Musculoskeletal:        General: Normal range of motion.     Cervical back: Normal range of motion and neck supple.  Neurological:     General: No focal deficit present.     Mental  Status: He is alert and oriented to person, place, and time. Mental status is at baseline.  Psychiatric:        Mood and Affect: Mood normal.        Behavior: Behavior normal.        Thought Content: Thought content normal.        Judgment: Judgment normal.     Lab Results:  CBC    Component Value Date/Time   WBC 15.2 (H) 03/19/2019 0326   RBC 4.90 03/19/2019 0326   HGB 14.7 03/19/2019 0326   HCT 46.2 03/19/2019 0326   PLT 205 03/19/2019 0326   MCV 94.3 03/19/2019 0326   MCH 30.0 03/19/2019 0326   MCHC 31.8 03/19/2019 0326   RDW 13.0 03/19/2019 0326   LYMPHSABS 0.8 03/18/2019 1411   MONOABS 1.1 (H) 03/18/2019 1411   EOSABS 0.1 03/18/2019 1411   BASOSABS 0.0 03/18/2019 1411    BMET    Component Value Date/Time   NA 138 03/19/2019 0326   K 4.1 03/19/2019 0326   CL 105 03/19/2019 0326   CO2 23 03/19/2019 0326   GLUCOSE 204 (H) 03/19/2019 0326   BUN 16 03/19/2019 0326   CREATININE 1.10 03/19/2019 0326   CALCIUM 8.6 (L) 03/19/2019 0326   GFRNONAA >60 03/19/2019 0326   GFRAA >60 03/19/2019 0326    BNP No results found for: BNP  ProBNP No results found for: PROBNP  Imaging: No results found.   Assessment & Plan:   Chronic bronchitis - Never smoked. Patient gets recurrent bronchitis symptoms 1-2 times a year. He had mild bronchiectasis and mucus plugging on CTA imaging back in 2015. He has been doing well until he developed increased chest congestion 4 weeks ago. Lungs mostly clear on exam today except for scant upper left lobe wheeze. Recommend treated for acute exacerbation with Doxycycline 100mg  x 10 days and 20mg  prednisone x 5 days. Advised he take mucinex 1200mg  twice daily for chest congestion. He needs repeat PFTs in 6 months, monitor lung function and DLCO as he works as 2016.   Mild persistent asthma without complication - Continue Symbicort 160 two puffs twice daily prn albuterol 2 puffs every 6 hours for sob/wheezing - Refills provided today - FU  with Dr. in 6 months or sooner if needed   Chronic rhinitis - Start OTC nasal spray such as flonase for rhinitis symptoms    , NP 12/16/2020

## 2020-12-16 NOTE — Assessment & Plan Note (Signed)
-   Start OTC nasal spray such as flonase for rhinitis symptoms

## 2021-06-07 ENCOUNTER — Other Ambulatory Visit: Payer: Self-pay | Admitting: Primary Care

## 2021-06-08 ENCOUNTER — Other Ambulatory Visit: Payer: Self-pay | Admitting: Primary Care

## 2021-06-19 ENCOUNTER — Ambulatory Visit: Payer: BLUE CROSS/BLUE SHIELD | Admitting: Internal Medicine

## 2021-07-01 ENCOUNTER — Ambulatory Visit: Payer: BLUE CROSS/BLUE SHIELD | Admitting: Primary Care

## 2021-07-08 ENCOUNTER — Ambulatory Visit (INDEPENDENT_AMBULATORY_CARE_PROVIDER_SITE_OTHER): Payer: BLUE CROSS/BLUE SHIELD | Admitting: Internal Medicine

## 2021-07-08 ENCOUNTER — Ambulatory Visit (INDEPENDENT_AMBULATORY_CARE_PROVIDER_SITE_OTHER): Payer: BLUE CROSS/BLUE SHIELD | Admitting: Primary Care

## 2021-07-08 ENCOUNTER — Ambulatory Visit (INDEPENDENT_AMBULATORY_CARE_PROVIDER_SITE_OTHER): Payer: BLUE CROSS/BLUE SHIELD

## 2021-07-08 ENCOUNTER — Encounter: Payer: Self-pay | Admitting: Primary Care

## 2021-07-08 ENCOUNTER — Other Ambulatory Visit: Payer: Self-pay

## 2021-07-08 VITALS — BP 116/68 | HR 89 | Temp 98.4°F | Ht 68.0 in | Wt 206.8 lb

## 2021-07-08 DIAGNOSIS — J42 Unspecified chronic bronchitis: Secondary | ICD-10-CM | POA: Diagnosis not present

## 2021-07-08 DIAGNOSIS — J189 Pneumonia, unspecified organism: Secondary | ICD-10-CM

## 2021-07-08 DIAGNOSIS — J4530 Mild persistent asthma, uncomplicated: Secondary | ICD-10-CM | POA: Diagnosis not present

## 2021-07-08 LAB — PULMONARY FUNCTION TEST
DL/VA % pred: 118 %
DL/VA: 4.97 ml/min/mmHg/L
DLCO cor % pred: 109 %
DLCO cor: 29.08 ml/min/mmHg
DLCO unc % pred: 109 %
DLCO unc: 29.08 ml/min/mmHg
FEF 25-75 Post: 5.71 L/sec
FEF 25-75 Pre: 5.17 L/sec
FEF2575-%Change-Post: 10 %
FEF2575-%Pred-Post: 205 %
FEF2575-%Pred-Pre: 185 %
FEV1-%Change-Post: 2 %
FEV1-%Pred-Post: 101 %
FEV1-%Pred-Pre: 99 %
FEV1-Post: 3.47 L
FEV1-Pre: 3.39 L
FEV1FVC-%Change-Post: 3 %
FEV1FVC-%Pred-Pre: 118 %
FEV6-%Change-Post: 0 %
FEV6-%Pred-Post: 87 %
FEV6-%Pred-Pre: 88 %
FEV6-Post: 3.77 L
FEV6-Pre: 3.8 L
FEV6FVC-%Change-Post: 0 %
FEV6FVC-%Pred-Post: 105 %
FEV6FVC-%Pred-Pre: 105 %
FVC-%Change-Post: 0 %
FVC-%Pred-Post: 83 %
FVC-%Pred-Pre: 83 %
FVC-Post: 3.77 L
FVC-Pre: 3.81 L
Post FEV1/FVC ratio: 92 %
Post FEV6/FVC ratio: 100 %
Pre FEV1/FVC ratio: 89 %
Pre FEV6/FVC Ratio: 100 %
RV % pred: 90 %
RV: 2.03 L
TLC % pred: 89 %
TLC: 6.11 L

## 2021-07-08 NOTE — Progress Notes (Signed)
 @Patient  ID: Dustin Randall, male    DOB: 06/28/1960, 61 y.o.   MRN: [id number]  Chief Complaint  Patient presents with   Follow-up    Patient is here to go over pft results.    Referring provider: Lyndel SafeGentry, Daniel E., MD  HPI: 61 year old male, never smoked. PMH significant for HTN, mild persistent asthma, chronic bronchitis, CAP, mild bronchiectasis. Patient of Dr. Sherene SiresWert. Maintained on Symbciort 160 two puffs twice daily, prn albuterol.   Previous [facility] pulmonary encounter: 12/16/2020 Patient presents today for 6 month follow-up. He is doing alright. Over the last 4 weeks he reports increased chest congestion. He has a productive cough with clear sputum. Associated nasal congestion. He tried mucinex for a few days several weeks ago. He is not currently using any OTC nasal sprays. He is complaint with Symbicort 160, recently needing to use albuterol rescue inhaler 1-2 times a day. Denies f/c/s, chest tightness, wheezing, N/V/D.   07/08/2021 Patient presents today for 6 month follow-up with PFTs. Patient was diagnosed with influenza A on 06/28/21, [facility] [place] and treated with tamiflu and prednisone 40mg  daily. He had diffuse wheezing and rhonchi on exam. CXR was obtained on 07/01/21 showing subtle left lower lobe infiltrate. He was given additional RX for clarithromycin x 10 days and medrol dose pack for suspected CAP. He is feeling quite a bit better this past week. Shortness of breath and cough have improved. He still has a very slight productive cough. No recent fevers. He is taking Symbicort 160 two puffs twice daily. He uses albuterol inhaler 1-2 times daily as needed when at work. He works at an [employer] and is around [employer]/p[employer]. He is a never smoker.   Pulmonary function testing 07/08/2021 PFTs >> FVC 3.77 (83%), FEV1 3.47 (101%), ratio 92, TLC 89%, DLCOunc 29.08 (109%)  No Known Allergies  Immunization History  Administered Date(s) Administered   Moderna Sars-Covid-2  Vaccination 01/03/2020, 01/31/2020   Pneumococcal Polysaccharide-23 08/07/2018    Past Medical History:  Diagnosis Date   Depression    Hypertension    Kidney stones    Pneumonia    Stroke (HCC)     Tobacco History: Social History   Tobacco Use  Smoking Status Never  Smokeless Tobacco Never   Counseling given: Not Answered   Outpatient Medications Prior to Visit  Medication Sig Dispense Refill   albuterol (PROVENTIL) (2.5 MG/3ML) 0.083% nebulizer solution Take 3 mLs (2.5 mg total) by nebulization every 6 (six) hours as needed for wheezing or shortness of breath. 75 mL 2   albuterol (VENTOLIN HFA) 108 (90 Base) MCG/ACT inhaler INHALE 2 PUFFS INTO THE LUNGS EVERY 6 HOURS AS NEEDED FOR WHEEZING OR SHORTNESS OF BREATH 6.7 g 1   Ascorbic Acid (VITAMIN C) 1000 MG tablet Take 1,000 mg by mouth daily.     budesonide-formoterol (SYMBICORT) 160-4.5 MCG/ACT inhaler Inhale 2 puffs into the lungs 2 (two) times daily. 1 each 11   buPROPion (WELLBUTRIN XL) 150 MG 24 hr tablet Take 150 mg by mouth 2 (two) times daily.     cholecalciferol (VITAMIN D) 1000 UNITS tablet Take 1,000 Units by mouth daily.     cyclobenzaprine (FLEXERIL) 5 MG tablet Take 5 mg by mouth at bedtime.     doxycycline (VIBRA-TABS) 100 MG tablet Take 1 tablet (100 mg total) by mouth 2 (two) times daily. 20 tablet 0   losartan (COZAAR) 25 MG tablet Take 25 mg by mouth daily.  Multiple Vitamin (MULTIVITAMIN WITH MINERALS) TABS tablet Take 1 tablet by mouth daily.     omeprazole (PRILOSEC OTC) 20 MG tablet Take 20 mg by mouth daily as needed (heartburn).     oxybutynin (DITROPAN-XL) 5 MG 24 hr tablet Take 5 mg by mouth at bedtime.      pravastatin (PRAVACHOL) 40 MG tablet Take 40 mg by mouth at bedtime.      predniSONE (DELTASONE) 10 MG tablet Take 2 tabs x 5 days 10 tablet 0   No facility-administered medications prior to visit.    Review of Systems  Review of Systems  Constitutional: Negative.   HENT: Negative.     Respiratory:  Positive for cough. Negative for chest tightness, shortness of breath and wheezing.   Psychiatric/Behavioral: Negative.      Physical Exam  BP 116/68 (BP Location: Right Arm, Patient Position: Sitting, Cuff Size: Large)    Pulse 89    Temp 98.4 F (36.9 C) (Oral)    Ht 5\' 8"  (1.727 m)    Wt 206 lb 12.8 oz (93.8 kg)    SpO2 95%    BMI 31.44 kg/m  Physical Exam Constitutional:      General: He is not in acute distress.    Appearance: Normal appearance. He is not ill-appearing.  HENT:     Head: Normocephalic and atraumatic.     Mouth/Throat:     Mouth: Mucous membranes are moist.     Pharynx: Oropharynx is clear.  Cardiovascular:     Rate and Rhythm: Normal rate and regular rhythm.  Pulmonary:     Effort: No respiratory distress.     Breath sounds: No wheezing.     Comments: Faint rhonchi left base  Musculoskeletal:        General: Normal range of motion.  Skin:    General: Skin is warm and dry.  Neurological:     General: No focal deficit present.     Mental Status: He is alert and oriented to person, place, and time. Mental status is at baseline.  Psychiatric:        Mood and Affect: Mood normal.        Behavior: Behavior normal.        Thought Content: Thought content normal.        Judgment: Judgment normal.     Lab Results:  CBC    Component Value Date/Time   WBC 15.2 (H) 03/19/2019 0326   RBC 4.90 03/19/2019 0326   HGB 14.7 03/19/2019 0326   HCT 46.2 03/19/2019 0326   PLT 205 03/19/2019 0326   MCV 94.3 03/19/2019 0326   MCH 30.0 03/19/2019 0326   MCHC 31.8 03/19/2019 0326   RDW 13.0 03/19/2019 0326   LYMPHSABS 0.8 03/18/2019 1411   MONOABS 1.1 (H) 03/18/2019 1411   EOSABS 0.1 03/18/2019 1411   BASOSABS 0.0 03/18/2019 1411    BMET    Component Value Date/Time   NA 138 03/19/2019 0326   K 4.1 03/19/2019 0326   CL 105 03/19/2019 0326   CO2 23 03/19/2019 0326   GLUCOSE 204 (H) 03/19/2019 0326   BUN 16 03/19/2019 0326   CREATININE 1.10  03/19/2019 0326   CALCIUM 8.6 (L) 03/19/2019 0326   GFRNONAA >60 03/19/2019 0326   GFRAA >60 03/19/2019 0326    BNP No results found for: BNP  ProBNP No results found for: PROBNP  Imaging: No results found.   Assessment & Plan:   Mild persistent asthma without complication - Clinical  onset in 2013. Works in [employer], around [employer]. IGE 728 in 2015. Allergic to cats, dogs, dust and mold. Pulmonary function testing today showed normal spirometry with BD response and normal diffusion capacity/ FEV1 3.47 (101%), RATIO 92. DLCO 29.08 (109%). Continue Symbicort 160 two puffs twice daily. Using SABA 1-2 times daily with improvement. Recommend adding Montelukast 10mg  at bedtime. If continues to have recurrent bronchitis consider adding biologics. FU in 6 months with Dr. or sooner if needed.   CAP (community acquired pneumonia) - Treated for suspected LLL PNA on 07/01/20 after testing positive for influenza A. Treated with clarithromycin x 10 days and medrol dose pack. Clinically improving, he has slight residual productive cough with faint rhonchi mainly left base on exam. Repeat CXR today.      08/29/20, NP 07/08/2021

## 2021-07-08 NOTE — Patient Instructions (Addendum)
Recommendation: Continue Symbicort 160 two puffs morning and evening Use albuterol inahler 2 puffs every 6 hours as needed for breakthrough shortness of breath/wheezing Start Singulair 10mg  bedtime daily  Orders: CXR today  Follow-up: 6 months with Dr. or sooner if needed     Montelukast Tablets What is this medication? MONTELUKAST (mon te LOO kast) prevents and treats the symptoms of asthma and allergies. It works by decreasing inflammation in the airways, making it easier to breathe. Do not use this medication to treat a sudden asthma attack. This medicine may be used for other purposes; ask your health care provider or pharmacist if you have questions. COMMON BRAND NAME(S): Singulair What should I tell my care team before I take this medication? They need to know if you have any of these conditions: Liver disease An unusual or allergic reaction to montelukast, other medications, foods, dyes, or preservatives Pregnant or trying to get pregnant Breast-feeding How should I use this medication? Take this medication by mouth with water. Take it as directed on the prescription label at the same time every day. You can take this medication with or without food. If it upsets your stomach, take it with food. Keep taking it unless your care team tells you to stop. A special MedGuide will be given to you by the pharmacist with each prescription and refill. Be sure to read this information carefully each time. Talk to your care team about the use of this medication in children. While this medication may be prescribed for children as young as 15 years for selected conditions, precautions do apply. Overdosage: If you think you have taken too much of this medicine contact a poison control center or emergency room at once. NOTE: This medicine is only for you. Do not share this medicine with others. What if I miss a dose? If you miss a dose, skip it. Take your next dose at the normal time. Do  not take extra or 2 doses at the same time to make up for the missed dose. What may interact with this medication? Medications for seizures like phenytoin, phenobarbital, and carbamazepine Rifabutin Rifampin This list may not describe all possible interactions. Give your health care provider a list of all the medicines, herbs, non-prescription drugs, or dietary supplements you use. Also tell them if you smoke, drink alcohol, or use illegal drugs. Some items may interact with your medicine. What should I watch for while using this medication? Visit your health care provider for regular checks on your progress. Tell your health care provider if your allergy or asthma symptoms do not improve. Take your medication even when you do not have symptoms. If you have asthma, talk to your health care provider about what to do in an acute asthma attack. Always have your rescue medication for asthma attacks with you. Patients and their families should watch for new or worsening thoughts of suicide or depression. Also watch for sudden changes in feelings such as feeling anxious, agitated, panicky, irritable, hostile, aggressive, impulsive, severely restless, overly excited and hyperactive, or not being able to sleep. Any worsening of mood or thoughts of suicide or dying should be reported to your health care provider right away. What side effects may I notice from receiving this medication? Side effects that you should report to your care team as soon as possible: Allergic reactions--skin rash, itching, hives, swelling of the face, lips, tongue, or throat Flu-like symptoms--fever, chills, muscle pain, cough, headache, fatigue Mood and behavior changes such as anxiety,  nervousness, confusion, hallucinations, irritability, hostility, thoughts of suicide or self-harm, worsening mood, feelings of depression Pain, tingling, or numbness in the hands or feet Sinus pain or pressure around the face or forehead Trouble  sleeping Vivid dreams or nightmares Side effects that usually do not require medical attention (report to your care team if they continue or are bothersome): Cough Diarrhea Headache Runny or stuffy nose Sore throat Stomach pain This list may not describe all possible side effects. Call your doctor for medical advice about side effects. You may report side effects to FDA at 1-800-FDA-1088. Where should I keep my medication? Keep out of the reach of children and pets. Store at room temperature between 15 and 30 degrees C (59 and 86 degrees F). Protect from light and moisture. Protect from light and moisture. Keep the container tightly closed. Get rid of any unused medication after the expiration date. To get rid of medications that are no longer needed or expired: Take the medication to a medication take-back program. Check with your pharmacy or law enforcement to find a location. If you cannot return the medication, check the label or package insert to see if the medication should be thrown out in the garbage or flushed down the toilet. If you are not sure, ask your care team. If it is safe to put in the trash, empty the medication out of the container. Mix the medication with cat litter, dirt, coffee grounds, or other unwanted substance. Seal the mixture in a bag or container. Put it in the trash. NOTE: This sheet is a summary. It may not cover all possible information. If you have questions about this medicine, talk to your doctor, pharmacist, or health care provider.  2022 Elsevier/Gold Standard (2020-07-11 00:00:00)

## 2021-07-08 NOTE — Progress Notes (Signed)
PFT done today. 

## 2021-07-08 NOTE — Assessment & Plan Note (Signed)
-   Treated for suspected LLL PNA on 07/01/20 after testing positive for influenza A. Treated with clarithromycin x 10 days and medrol dose pack. Clinically improving, he has slight residual productive cough with faint rhonchi mainly left base on exam. Repeat CXR today.

## 2021-07-08 NOTE — Progress Notes (Signed)
Please let patient know CXR showed clear lungs, no residual infiltrate or PNA

## 2021-07-08 NOTE — Assessment & Plan Note (Signed)
-   Clinical onset in 2013. Works in [employer], around [employer]. IGE 728 in 2015. Allergic to cats, dogs, dust and mold. Pulmonary function testing today showed normal spirometry with BD response and normal diffusion capacity/ FEV1 3.47 (101%), RATIO 92. DLCO 29.08 (109%). Continue Symbicort 160 two puffs twice daily. Using SABA 1-2 times daily with improvement. Recommend adding Montelukast 10mg  at bedtime. If continues to have recurrent bronchitis consider adding biologics. FU in 6 months with Dr. or sooner if needed.

## 2021-07-22 ENCOUNTER — Telehealth: Payer: Self-pay | Admitting: Primary Care

## 2021-07-23 MED ORDER — MONTELUKAST SODIUM 10 MG PO TABS: 10.0000 mg | ORAL_TABLET | Freq: Every day | ORAL | 11 refills | Status: DC

## 2021-07-23 NOTE — Telephone Encounter (Signed)
Rx for singulair has been sent to preferred pharmacy for pt. Called and spoke with pt letting him know this was done and he verbalized understanding.nothing further needed.

## 2021-07-26 ENCOUNTER — Other Ambulatory Visit: Payer: Self-pay | Admitting: Primary Care

## 2021-07-28 ENCOUNTER — Other Ambulatory Visit: Payer: Self-pay | Admitting: Primary Care

## 2021-09-13 ENCOUNTER — Other Ambulatory Visit: Payer: Self-pay | Admitting: Primary Care

## 2022-02-03 ENCOUNTER — Other Ambulatory Visit: Payer: Self-pay | Admitting: Primary Care

## 2022-04-19 ENCOUNTER — Other Ambulatory Visit: Payer: Self-pay | Admitting: Primary Care

## 2023-04-22 ENCOUNTER — Other Ambulatory Visit: Payer: Self-pay | Admitting: Primary Care

## 2023-04-26 ENCOUNTER — Telehealth: Payer: Self-pay | Admitting: *Deleted

## 2023-04-26 NOTE — Telephone Encounter (Signed)
Patient was last seen 07/08/2021, he will need and office visit for further refills.

## 2023-05-24 ENCOUNTER — Other Ambulatory Visit: Payer: Self-pay | Admitting: Internal Medicine

## 2023-06-21 ENCOUNTER — Other Ambulatory Visit: Payer: Self-pay | Admitting: Internal Medicine

## 2023-07-24 ENCOUNTER — Other Ambulatory Visit: Payer: Self-pay | Admitting: Internal Medicine
